# Patient Record
Sex: Female | Born: 1980
Health system: Southern US, Community
[De-identification: ages and names within clinical notes are randomized; demographics above are authoritative.]

## PROBLEM LIST (undated history)

## (undated) HISTORY — PX: TUBAL LIGATION: SHX77

---

## 2008-08-16 ENCOUNTER — Emergency Department (HOSPITAL_COMMUNITY): Admission: EM | Admit: 2008-08-16 | Discharge: 2008-08-16 | Payer: Self-pay | Admitting: Emergency Medicine

## 2010-05-27 LAB — URINALYSIS, ROUTINE W REFLEX MICROSCOPIC
Nitrite: NEGATIVE
Protein, ur: 100 mg/dL — AB
Urobilinogen, UA: 2 mg/dL — ABNORMAL HIGH (ref 0.0–1.0)

## 2010-05-27 LAB — DIFFERENTIAL
Basophils Relative: 0 % (ref 0–1)
Monocytes Relative: 2 % — ABNORMAL LOW (ref 3–12)
Neutro Abs: 6.6 10*3/uL (ref 1.7–7.7)
Neutrophils Relative %: 69 % (ref 43–77)

## 2010-05-27 LAB — GC/CHLAMYDIA PROBE AMP, GENITAL: GC Probe Amp, Genital: NEGATIVE

## 2010-05-27 LAB — WET PREP, GENITAL
Trich, Wet Prep: NONE SEEN
Yeast Wet Prep HPF POC: NONE SEEN

## 2010-05-27 LAB — CBC
HCT: 42.6 % (ref 36.0–46.0)
Hemoglobin: 14.6 g/dL (ref 12.0–15.0)
MCHC: 34.4 g/dL (ref 30.0–36.0)
MCV: 104 fL — ABNORMAL HIGH (ref 78.0–100.0)
RDW: 13.1 % (ref 11.5–15.5)

## 2010-05-27 LAB — COMPREHENSIVE METABOLIC PANEL
Alkaline Phosphatase: 60 U/L (ref 39–117)
BUN: 17 mg/dL (ref 6–23)
Calcium: 9.2 mg/dL (ref 8.4–10.5)
Glucose, Bld: 91 mg/dL (ref 70–99)
Potassium: 3.9 mEq/L (ref 3.5–5.1)
Total Protein: 7.3 g/dL (ref 6.0–8.3)

## 2010-05-27 LAB — URINE MICROSCOPIC-ADD ON

## 2010-07-24 ENCOUNTER — Emergency Department (HOSPITAL_BASED_OUTPATIENT_CLINIC_OR_DEPARTMENT_OTHER)
Admission: EM | Admit: 2010-07-24 | Discharge: 2010-07-24 | Disposition: A | Discharge status: Home / Self Care | Payer: PRIVATE HEALTH INSURANCE | Attending: Emergency Medicine | Admitting: Emergency Medicine

## 2010-07-24 DIAGNOSIS — K029 Dental caries, unspecified: Secondary | ICD-10-CM | POA: Insufficient documentation

## 2010-07-24 DIAGNOSIS — K089 Disorder of teeth and supporting structures, unspecified: Secondary | ICD-10-CM | POA: Insufficient documentation

## 2011-10-29 ENCOUNTER — Emergency Department (HOSPITAL_BASED_OUTPATIENT_CLINIC_OR_DEPARTMENT_OTHER)
Admission: EM | Admit: 2011-10-29 | Discharge: 2011-10-29 | Disposition: A | Discharge status: Home / Self Care | Payer: Self-pay | Attending: Emergency Medicine | Admitting: Emergency Medicine

## 2011-10-29 ENCOUNTER — Encounter (HOSPITAL_BASED_OUTPATIENT_CLINIC_OR_DEPARTMENT_OTHER): Payer: Self-pay | Admitting: Family Medicine

## 2011-10-29 DIAGNOSIS — F17200 Nicotine dependence, unspecified, uncomplicated: Secondary | ICD-10-CM | POA: Insufficient documentation

## 2011-10-29 DIAGNOSIS — R609 Edema, unspecified: Secondary | ICD-10-CM | POA: Insufficient documentation

## 2011-10-29 DIAGNOSIS — R600 Localized edema: Secondary | ICD-10-CM

## 2011-10-29 LAB — URINALYSIS, ROUTINE W REFLEX MICROSCOPIC
Bilirubin Urine: NEGATIVE
Glucose, UA: NEGATIVE mg/dL
Hgb urine dipstick: NEGATIVE
Specific Gravity, Urine: 1.011 (ref 1.005–1.030)
pH: 6.5 (ref 5.0–8.0)

## 2011-10-29 LAB — BASIC METABOLIC PANEL
BUN: 11 mg/dL (ref 6–23)
CO2: 25 mEq/L (ref 19–32)
Chloride: 103 mEq/L (ref 96–112)
Glucose, Bld: 96 mg/dL (ref 70–99)
Potassium: 3.9 mEq/L (ref 3.5–5.1)

## 2011-10-29 LAB — CBC
HCT: 37.7 % (ref 36.0–46.0)
Hemoglobin: 13.8 g/dL (ref 12.0–15.0)
MCH: 36.1 pg — ABNORMAL HIGH (ref 26.0–34.0)
MCHC: 36.6 g/dL — ABNORMAL HIGH (ref 30.0–36.0)

## 2011-10-29 NOTE — ED Provider Notes (Signed)
History     CSN: [id number]  Arrival date & time 10/29/11  0900   First MD Initiated Contact with Patient 10/29/11 [phone]      Chief Complaint  Patient presents with  . Foot Swelling     The history is provided by the patient.   patient reports developing new swelling to her bilateral ankles.  She reports her ankles feel puffy.  She's had no recent long travel or surgery.  No history of DVT or pulmonary embolism.  No family history of thromboembolic disease.  She has no chest pain or shortness of breath.  She reports that her urination has decreased somewhat.  She has no dysuria or urinary frequency.  No fevers or chills.  No pain with ambulation.  She reports her lower extremities ache  History reviewed. No pertinent past medical history.  Past Surgical History  Procedure Date  . Tubal ligation     No family history on file.  History  Substance Use Topics  . Smoking status: Current Every Day Smoker  . Smokeless tobacco: Not on file  . Alcohol Use: Yes    OB History    Grav Para Term Preterm Abortions TAB SAB Ect Mult Living                  Review of Systems  All other systems reviewed and are negative.    Allergies  Review of patient's allergies indicates no known allergies.  Home Medications  No current outpatient prescriptions on file.  BP 133/89  Pulse 92  Temp 98.3 F (36.8 C) (Oral)  Resp 16  Ht 5\' 7"  (1.702 m)  Wt 264 lb (119.75 kg)  BMI 41.35 kg/m2  SpO2 99%  LMP 10/23/2011  Physical Exam  Nursing note and vitals reviewed. Constitutional: She is oriented to person, place, and time. She appears well-developed and well-nourished. No distress.  HENT:  Head: Normocephalic and atraumatic.  Eyes: EOM are normal.  Neck: Normal range of motion.  Cardiovascular: Normal rate, regular rhythm and normal heart sounds.   Pulmonary/Chest: Effort normal and breath sounds normal.  Abdominal: Soft. She exhibits no distension. There is no tenderness.    Musculoskeletal: Normal range of motion.       Mild swelling of bilateral ankles.  She has no significant swelling of her thighs or lower legs.  Normal pulses in her bilateral PT and DP pulses.  No erythema or warmth.  No significant pain with range of motion of her bilateral ankles   Neurological: She is alert and oriented to person, place, and time.  Skin: Skin is warm and dry.  Psychiatric: She has a normal mood and affect. Judgment normal.    ED Course  Procedures (including critical care time)  Labs Reviewed  BASIC METABOLIC PANEL - Abnormal; Notable for the following:    GFR calc non Af Amer 84 (*)     All other components within normal limits  CBC - Abnormal; Notable for the following:    RBC 3.82 (*)     MCH 36.1 (*)     MCHC 36.6 (*)     All other components within normal limits  URINALYSIS, ROUTINE W REFLEX MICROSCOPIC   No results found.   1. Bilateral lower extremity edema       MDM  Vital signs are normal.  Mild swelling of bilateral ankles.  My suspicion for deep vein thrombosis is very low as the patient has no risk factors for this.  She has  no evidence of nephrotic syndrome.  I recommended elevation compression stockings and PCP followup.  The patient understands to return the emergency apartment for new or worsening symptoms.        Lyanne Co, MD 10/29/11 [phone]

## 2011-10-29 NOTE — ED Notes (Addendum)
Pt c/o bilateral legs and feet swelling "off and on but worse last night". Pt c/o tightness and worse with ambulation. Pt denies shob, cough.

## 2012-09-27 ENCOUNTER — Encounter (HOSPITAL_BASED_OUTPATIENT_CLINIC_OR_DEPARTMENT_OTHER): Payer: Self-pay | Admitting: *Deleted

## 2012-09-27 ENCOUNTER — Emergency Department (HOSPITAL_BASED_OUTPATIENT_CLINIC_OR_DEPARTMENT_OTHER)
Admission: EM | Admit: 2012-09-27 | Discharge: 2012-09-27 | Disposition: A | Discharge status: Home / Self Care | Payer: Self-pay | Attending: Emergency Medicine | Admitting: Emergency Medicine

## 2012-09-27 ENCOUNTER — Emergency Department (HOSPITAL_BASED_OUTPATIENT_CLINIC_OR_DEPARTMENT_OTHER): Payer: Self-pay

## 2012-09-27 DIAGNOSIS — W108XXA Fall (on) (from) other stairs and steps, initial encounter: Secondary | ICD-10-CM | POA: Insufficient documentation

## 2012-09-27 DIAGNOSIS — M25561 Pain in right knee: Secondary | ICD-10-CM

## 2012-09-27 DIAGNOSIS — F17200 Nicotine dependence, unspecified, uncomplicated: Secondary | ICD-10-CM | POA: Insufficient documentation

## 2012-09-27 DIAGNOSIS — Y92009 Unspecified place in unspecified non-institutional (private) residence as the place of occurrence of the external cause: Secondary | ICD-10-CM | POA: Insufficient documentation

## 2012-09-27 DIAGNOSIS — X500XXA Overexertion from strenuous movement or load, initial encounter: Secondary | ICD-10-CM | POA: Insufficient documentation

## 2012-09-27 DIAGNOSIS — S8990XA Unspecified injury of unspecified lower leg, initial encounter: Secondary | ICD-10-CM | POA: Insufficient documentation

## 2012-09-27 DIAGNOSIS — Y939 Activity, unspecified: Secondary | ICD-10-CM | POA: Insufficient documentation

## 2012-09-27 MED ORDER — IBUPROFEN 800 MG PO TABS: 800.0000 mg | ORAL_TABLET | Freq: Three times a day (TID) | ORAL | Status: DC | PRN

## 2012-09-27 NOTE — ED Provider Notes (Signed)
CSN: [id number]     Arrival date & time 09/27/12  1007 History     First MD Initiated Contact with Patient 09/27/12 1019     Chief Complaint  Patient presents with  . Knee Pain   (Consider location/radiation/quality/duration/timing/severity/associated sxs/prior Treatment) HPI 32 year old female presents with knee pain.  Patient reports that Saturday 8/9 she fell going down the steps.  When she fell she twist (internally rotated) her right knee and heard a "pop."  She reports pain 4-5/10 in severity and with certain ROM 10/10 in severity.  The pain is located on the medial aspect of her right knee.  She endorses some associated swelling and difficulty bearing weight.  She is able to ambulate but attempts to avoid pain by shifting the weight to her left leg.  She has used a heating pad and ice with some relief in pain.    History reviewed. No pertinent past medical history. Past Surgical History  Procedure Laterality Date  . Tubal ligation     No family history on file. History  Substance Use Topics  . Smoking status: Current Every Day Smoker  . Smokeless tobacco: Not on file  . Alcohol Use: Yes   OB History   Grav Para Term Preterm Abortions TAB SAB Ect Mult Living                 Review of Systems  Constitutional: Negative for fever and chills.  Respiratory: Negative for shortness of breath.   Cardiovascular: Positive for leg swelling. Negative for chest pain.  Musculoskeletal: Positive for joint swelling.    Allergies  Review of patient's allergies indicates no known allergies.  Home Medications  No current outpatient prescriptions on file. BP 126/76  Pulse 74  Temp(Src) 98.4 F (36.9 C) (Oral)  Resp 16  SpO2 100%  LMP 09/17/2012 Physical Exam  Constitutional: She is oriented to person, place, and time. She appears well-developed and well-nourished. No distress.  Cardiovascular: Normal rate, regular rhythm and normal heart sounds.   No murmur  heard. Pulmonary/Chest: Effort normal and breath sounds normal. She has no wheezes. She has no rales.  Musculoskeletal:  Left knee - normal ROM. Negative anterior and posterior drawer.  No laxity with varus or valgus stress.  Right knee - Negative lachman's and posterior drawer.  Tender to palpation at the medial joint line.   No appreciable swelling, however this is difficult to discern secondary to body habitus. Pain with valgus stress.  Hips - no pain or decreased ROM bilaterally.  Neurological: She is alert and oriented to person, place, and time.  Skin: Skin is warm and dry.    ED Course   Procedures (including critical care time)  Labs Reviewed - No data to display Dg Knee Complete 4 Views Right  09/27/2012   *RADIOLOGY REPORT*  Clinical Data: Right knee pain following injury.  RIGHT KNEE - COMPLETE 4+ VIEW  Comparison: None.  Findings: No acute fracture or dislocation is noted.  No soft tissue changes are seen.  IMPRESSION: No acute abnormality noted.   Original Report Authenticated By: Alcide Clever, M.D.   No diagnosis found.  MDM  32 year old female presents with Right knee pain following fall on 8/9. - Complete 4 View of Right knee obtained; Xray negative as above.  - Given physical exam findings and mechanism of injury, patient likely has underlying MCL tear or meniscus tear.  - Our hospital does not have MRI capabilities today.  Will place in  knee immobilizer and send to Local sports medicine physician - Dr. Pearletha Forge.   Tommie Sams, DO 09/27/12 1241

## 2012-09-27 NOTE — ED Notes (Signed)
Patient states she stepped off her last step and fell in her grass three days ago.  Now has pain in right left and knee.

## 2012-09-30 NOTE — ED Provider Notes (Signed)
I saw and evaluated the patient, reviewed the resident's note and I agree with the findings and plan.   .Face to face Exam:  General:  Awake HEENT:  Atraumatic Resp:  Normal effort Abd:  Nondistended Neuro:No focal weakness   Nelia Shi, MD 09/30/12 [phone]

## 2014-08-26 ENCOUNTER — Encounter (HOSPITAL_BASED_OUTPATIENT_CLINIC_OR_DEPARTMENT_OTHER): Payer: Self-pay | Admitting: *Deleted

## 2014-08-26 ENCOUNTER — Emergency Department (HOSPITAL_BASED_OUTPATIENT_CLINIC_OR_DEPARTMENT_OTHER): Payer: PRIVATE HEALTH INSURANCE

## 2014-08-26 ENCOUNTER — Emergency Department (HOSPITAL_BASED_OUTPATIENT_CLINIC_OR_DEPARTMENT_OTHER)
Admission: EM | Admit: 2014-08-26 | Discharge: 2014-08-26 | Disposition: A | Discharge status: Home / Self Care | Payer: Self-pay | Attending: Emergency Medicine | Admitting: Emergency Medicine

## 2014-08-26 DIAGNOSIS — N939 Abnormal uterine and vaginal bleeding, unspecified: Secondary | ICD-10-CM

## 2014-08-26 DIAGNOSIS — R102 Pelvic and perineal pain: Secondary | ICD-10-CM

## 2014-08-26 DIAGNOSIS — Z9851 Tubal ligation status: Secondary | ICD-10-CM | POA: Insufficient documentation

## 2014-08-26 DIAGNOSIS — Z3202 Encounter for pregnancy test, result negative: Secondary | ICD-10-CM | POA: Insufficient documentation

## 2014-08-26 DIAGNOSIS — R52 Pain, unspecified: Secondary | ICD-10-CM

## 2014-08-26 DIAGNOSIS — Z720 Tobacco use: Secondary | ICD-10-CM | POA: Insufficient documentation

## 2014-08-26 DIAGNOSIS — N938 Other specified abnormal uterine and vaginal bleeding: Secondary | ICD-10-CM | POA: Insufficient documentation

## 2014-08-26 LAB — URINALYSIS, ROUTINE W REFLEX MICROSCOPIC
Bilirubin Urine: NEGATIVE
GLUCOSE, UA: NEGATIVE mg/dL
Hgb urine dipstick: NEGATIVE
KETONES UR: NEGATIVE mg/dL
LEUKOCYTES UA: NEGATIVE
NITRITE: NEGATIVE
PROTEIN: NEGATIVE mg/dL
Specific Gravity, Urine: 1.019 (ref 1.005–1.030)
UROBILINOGEN UA: 0.2 mg/dL (ref 0.0–1.0)
pH: 6.5 (ref 5.0–8.0)

## 2014-08-26 LAB — PREGNANCY, URINE: PREG TEST UR: NEGATIVE

## 2014-08-26 MED ORDER — KETOROLAC TROMETHAMINE 30 MG/ML IJ SOLN
30.0000 mg | Freq: Once | INTRAMUSCULAR | Status: AC
  Administered 2014-08-26: 30 mg via INTRAVENOUS
  Filled 2014-08-26: qty 1

## 2014-08-26 MED ORDER — MORPHINE SULFATE 4 MG/ML IJ SOLN
4.0000 mg | Freq: Once | INTRAMUSCULAR | Status: AC
  Administered 2014-08-26: 4 mg via INTRAVENOUS
  Filled 2014-08-26: qty 1

## 2014-08-26 NOTE — ED Notes (Signed)
Pt transported to ultrasound by technician.

## 2014-08-26 NOTE — Discharge Instructions (Signed)
[facility] with [facility]. The contact information has been provided in this discharge summary. Call to arrange this appointment for the next week.   Abnormal Uterine Bleeding Abnormal uterine bleeding can affect women at various stages in life, including teenagers, women in their reproductive years, pregnant women, and women who have reached menopause. Several kinds of uterine bleeding are considered abnormal, including:  Bleeding or spotting between periods.   Bleeding after sexual intercourse.   Bleeding that is heavier or more than normal.   Periods that last longer than usual.  Bleeding after menopause.  Many cases of abnormal uterine bleeding are minor and simple to treat, while others are more serious. Any type of abnormal bleeding should be evaluated by your health care provider. Treatment will depend on the cause of the bleeding. HOME CARE INSTRUCTIONS Monitor your condition for any changes. The following actions may help to alleviate any discomfort you are experiencing:  Avoid the use of tampons and douches as directed by your health care provider.  Change your pads frequently. You should get regular pelvic exams and Pap tests. Keep all follow-up appointments for diagnostic tests as directed by your health care provider.  SEEK MEDICAL CARE IF:   Your bleeding lasts more than 1 week.   You feel dizzy at times.  SEEK IMMEDIATE MEDICAL CARE IF:   You pass out.   You are changing pads every 15 to 30 minutes.   You have abdominal pain.  You have a fever.   You become sweaty or weak.   You are passing large blood clots from the vagina.   You start to feel nauseous and vomit. MAKE SURE YOU:   Understand these instructions.  Will watch your condition.  Will get help right away if you are not doing well or get worse. Document Released: 02/03/2005 Document Revised: 02/08/2013 Document Reviewed: 09/02/2012 [facility] Patient  Information 2015 [employer] [place]. This information is not intended to replace advice given to you by your health care provider. Make sure you discuss any questions you have with your health care provider.

## 2014-08-26 NOTE — ED Notes (Signed)
abd pain x 2 weeks- states her LMP was early and lasted longer than usual, with heavier bleeding- frequent BM since yesterday

## 2014-08-26 NOTE — ED Notes (Signed)
Pt states bloating and abdominal pain since July 3rd with onset of MP.  Period has since stopped with exception of blood clots and pt c/o sharp pains and abdominal cramping.  Pt states diarrhea started yesterday and has gone to push out BM's 6 times today with no persisting diarrhea but an urge to go with small amounts produced.

## 2014-08-26 NOTE — ED Provider Notes (Signed)
CSN: [id number]   Arrival date & time 08/26/14 1626  History  This chart was scribed for  Geoffery Lyons, MD by Bethel Born, ED Scribe. This patient was seen in room MH03/MH03 and the patient's care was started at 5:17 PM.  Chief Complaint  Patient presents with  . Abdominal Pain    HPI Patient is a 34 y.o. female presenting with abdominal pain. The history is provided by the patient.  Abdominal Pain Pain location:  LLQ and RLQ Pain quality: stabbing   Pain severity:  Moderate Onset quality:  Gradual Duration:  2 weeks Timing:  Constant Progression:  Worsening Chronicity:  New Relieved by:  Nothing Worsened by:  Nothing tried Ineffective treatments:  NSAIDs Associated symptoms: vomiting   Associated symptoms: no chest pain, no chills, no constipation, no diarrhea, no dysuria and no fever    Randy Biederman is a 34 y.o. female who presents to the Emergency Department complaining of constant lower abdominal pain with onset 2 weeks ago. The pain is rated 6/10 in severity and described as stabbing. Ibuprofen provided insufficient relief PTA. Associated symptoms include vomiting 4 days ago, and change in menstrual cycle (Irregular, more clotting, heavier, 1 day longer). Pt denies fever, diarrhea, constipation, and dysuria.  Pt states that she was screened for STDs 2 weeks ago at a clinic [facility]. At that time she had a pelvic exam. All tests were negative.No history of ovarian cyst.   History reviewed. No pertinent past medical history.  Past Surgical History  Procedure Laterality Date  . Tubal ligation      No family history on file.  History  Substance Use Topics  . Smoking status: Current Every Day Smoker    Types: Cigarettes  . Smokeless tobacco: Never Used  . Alcohol Use: Yes     Comment: occasional     Review of Systems  Constitutional: Negative for fever and chills.  Cardiovascular: Negative for chest pain.  Gastrointestinal: Positive for vomiting and abdominal  pain. Negative for diarrhea and constipation.  Genitourinary: Negative for dysuria.       Change in menstrual cycle  All other systems reviewed and are negative.   Home Medications   Prior to Admission medications   Medication Sig Start Date End Date Taking? Authorizing Provider  ibuprofen (ADVIL,MOTRIN) 800 MG tablet Take 1 tablet (800 mg total) by mouth every 8 (eight) hours as needed for pain. 09/27/12   Tommie Sams, MD    Allergies  Latex  Triage Vitals: BP 145/83 mmHg  Pulse 86  Temp(Src) 98.4 F (36.9 C) (Oral)  Resp 18  Ht  (1.702 m)  Wt 245 lb (111.131 kg)  BMI 38.36 kg/m2  SpO2 100%  LMP 08/20/2014  Physical Exam  Constitutional: She is oriented to person, place, and time and well-developed, well-nourished, and in no distress. No distress.  HENT:  Head: Normocephalic and atraumatic.  Neck: Normal range of motion. Neck supple.  Cardiovascular: Normal rate, regular rhythm and normal heart sounds.   No murmur heard. Pulmonary/Chest: Effort normal and breath sounds normal. No respiratory distress.  Abdominal: Soft. Bowel sounds are normal. She exhibits no distension. There is tenderness. There is no rebound and no guarding.  TTP in the lower abdomen including RLQ, suprapubic, but most notably in the LLQ.   Musculoskeletal: Normal range of motion. She exhibits no edema.  Neurological: She is alert and oriented to person, place, and time.  Skin: Skin is warm and dry. She is not diaphoretic.  Nursing note and vitals reviewed.   ED Course  Procedures   DIAGNOSTIC STUDIES: Oxygen Saturation is 100% on RA, normal by my interpretation.    COORDINATION OF CARE: 5:25 PM Discussed treatment plan which includes lab work, pelvic US, morphine, and Toradol  with pt at bedside and pt agreed to plan.  Labs Review-  Labs Reviewed  PREGNANCY, URINE  URINALYSIS, ROUTINE W REFLEX MICROSCOPIC (NOT AT [facility])    Imaging Review No results found.  EKG  Interpretation None    MDM   Final diagnoses:  None   Patient presents here with complaints of vaginal bleeding for the past week. She reports abdominal pain ongoing for several weeks and was seen at the community health for this. She apparently had a pelvic examination and all the studies performed which were unremarkable. She is now having bleeding and presents for this. Her hemoglobin is stable and laboratory studies are otherwise unremarkable. I did perform an ultrasound which revealed no evidence for ovarian cyst, but did show a small uterine fibroid. This may be the cause of her bleeding and I feel she requires follow-up with GYN. She will be given the number for the women[facility] hospital clinic and advised to follow-up with them if her bleeding persists.  I personally performed the services described in this documentation, which was scribed in my presence. The recorded information has been reviewed and is accurate.       Geoffery Lyonsouglas Tyrez Berrios, MD 08/26/14 [phone]

## 2015-02-13 ENCOUNTER — Encounter (HOSPITAL_BASED_OUTPATIENT_CLINIC_OR_DEPARTMENT_OTHER): Payer: Self-pay | Admitting: Emergency Medicine

## 2015-02-13 ENCOUNTER — Emergency Department (HOSPITAL_BASED_OUTPATIENT_CLINIC_OR_DEPARTMENT_OTHER)
Admission: EM | Admit: 2015-02-13 | Discharge: 2015-02-13 | Disposition: A | Discharge status: Home / Self Care | Payer: PRIVATE HEALTH INSURANCE | Attending: Emergency Medicine | Admitting: Emergency Medicine

## 2015-02-13 DIAGNOSIS — Z91040 Latex allergy status: Secondary | ICD-10-CM | POA: Insufficient documentation

## 2015-02-13 DIAGNOSIS — J069 Acute upper respiratory infection, unspecified: Secondary | ICD-10-CM

## 2015-02-13 DIAGNOSIS — J029 Acute pharyngitis, unspecified: Secondary | ICD-10-CM

## 2015-02-13 DIAGNOSIS — F17210 Nicotine dependence, cigarettes, uncomplicated: Secondary | ICD-10-CM | POA: Insufficient documentation

## 2015-02-13 LAB — RAPID STREP SCREEN (MED CTR MEBANE ONLY): Streptococcus, Group A Screen (Direct): NEGATIVE

## 2015-02-13 MED ORDER — DEXAMETHASONE 6 MG PO TABS
12.0000 mg | ORAL_TABLET | Freq: Once | ORAL | Status: AC
  Administered 2015-02-13: 12 mg via ORAL
  Filled 2015-02-13: qty 2

## 2015-02-13 NOTE — ED Notes (Signed)
Pt reports sore throat that feels like it is burning, associated with cough and congestion that started yesterday

## 2015-02-13 NOTE — ED Notes (Signed)
MD at bedside. 

## 2015-02-13 NOTE — Discharge Instructions (Signed)
Pharyngitis Pharyngitis is redness, pain, and swelling (inflammation) of your pharynx.  CAUSES  Pharyngitis is usually caused by infection. Most of the time, these infections are from viruses (viral) and are part of a cold. However, sometimes pharyngitis is caused by bacteria (bacterial). Pharyngitis can also be caused by allergies. Viral pharyngitis may be spread from person to person by coughing, sneezing, and personal items or utensils (cups, forks, spoons, toothbrushes). Bacterial pharyngitis may be spread from person to person by more intimate contact, such as kissing.  SIGNS AND SYMPTOMS  Symptoms of pharyngitis include:   Sore throat.   Tiredness (fatigue).   Low-grade fever.   Headache.  Joint pain and muscle aches.  Skin rashes.  Swollen lymph nodes.  Plaque-like film on throat or tonsils (often seen with bacterial pharyngitis). DIAGNOSIS  Your health care provider will ask you questions about your illness and your symptoms. Your medical history, along with a physical exam, is often all that is needed to diagnose pharyngitis. Sometimes, a rapid strep test is done. Other lab tests may also be done, depending on the suspected cause.  TREATMENT  Viral pharyngitis will usually get better in 3-4 days without the use of medicine. Bacterial pharyngitis is treated with medicines that kill germs (antibiotics).  HOME CARE INSTRUCTIONS   Drink enough water and fluids to keep your urine clear or pale yellow.   Only take over-the-counter or prescription medicines as directed by your health care provider:   If you are prescribed antibiotics, make sure you finish them even if you start to feel better.   Do not take aspirin.   Get lots of rest.   Gargle with 8 oz of salt water ( tsp of salt per 1 qt of water) as often as every 1-2 hours to soothe your throat.   Throat lozenges (if you are not at risk for choking) or sprays may be used to soothe your throat. SEEK MEDICAL  CARE IF:   You have large, tender lumps in your neck.  You have a rash.  You cough up green, yellow-brown, or bloody spit. SEEK IMMEDIATE MEDICAL CARE IF:   Your neck becomes stiff.  You drool or are unable to swallow liquids.  You vomit or are unable to keep medicines or liquids down.  You have severe pain that does not go away with the use of recommended medicines.  You have trouble breathing (not caused by a stuffy nose). MAKE SURE YOU:   Understand these instructions.  Will watch your condition.  Will get help right away if you are not doing well or get worse.   This information is not intended to replace advice given to you by your health care provider. Make sure you discuss any questions you have with your health care provider.   Document Released: 02/03/2005 Document Revised: 11/24/2012 Document Reviewed: 10/11/2012 Elsevier Interactive Patient Education 2016 Elsevier Inc.  Cough, Adult Coughing is a reflex that clears your throat and your airways. Coughing helps to heal and protect your lungs. It is normal to cough occasionally, but a cough that happens with other symptoms or lasts a long time may be a sign of a condition that needs treatment. A cough may last only 2-3 weeks (acute), or it may last longer than 8 weeks (chronic). CAUSES Coughing is commonly caused by:  Breathing in substances that irritate your lungs.  A viral or bacterial respiratory infection.  Allergies.  Asthma.  Postnasal drip.  Smoking.  Acid backing up from the stomach  into the esophagus (gastroesophageal reflux).  Certain medicines.  Chronic lung problems, including COPD (or rarely, lung cancer).  Other medical conditions such as heart failure. HOME CARE INSTRUCTIONS  Pay attention to any changes in your symptoms. Take these actions to help with your discomfort:  Take medicines only as told by your health care provider.  If you were prescribed an antibiotic medicine, take it  as told by your health care provider. Do not stop taking the antibiotic even if you start to feel better.  Talk with your health care provider before you take a cough suppressant medicine.  Drink enough fluid to keep your urine clear or pale yellow.  If the air is dry, use a cold steam vaporizer or humidifier in your bedroom or your home to help loosen secretions.  Avoid anything that causes you to cough at work or at home.  If your cough is worse at night, try sleeping in a semi-upright position.  Avoid cigarette smoke. If you smoke, quit smoking. If you need help quitting, ask your health care provider.  Avoid caffeine.  Avoid alcohol.  Rest as needed. SEEK MEDICAL CARE IF:   You have new symptoms.  You cough up pus.  Your cough does not get better after 2-3 weeks, or your cough gets worse.  You cannot control your cough with suppressant medicines and you are losing sleep.  You develop pain that is getting worse or pain that is not controlled with pain medicines.  You have a fever.  You have unexplained weight loss.  You have night sweats. SEEK IMMEDIATE MEDICAL CARE IF:  You cough up blood.  You have difficulty breathing.  Your heartbeat is very fast.   This information is not intended to replace advice given to you by your health care provider. Make sure you discuss any questions you have with your health care provider.   Document Released: 08/02/2010 Document Revised: 10/25/2014 Document Reviewed: 04/12/2014 Elsevier Interactive Patient Education [employer] Elsevier Inc.

## 2015-02-15 LAB — CULTURE, GROUP A STREP: Strep A Culture: POSITIVE — AB

## 2015-02-16 ENCOUNTER — Telehealth (HOSPITAL_COMMUNITY): Payer: Self-pay

## 2015-02-16 NOTE — Telephone Encounter (Signed)
Post ED Visit - Positive Culture Follow-up: Successful Patient Follow-Up  Culture assessed and recommendations reviewed by: []  Enzo BiNathan Batchelder, Pharm.D. []  Celedonio MiyamotoJeremy Frens, Pharm.D., BCPS [x]  Garvin FilaMike Maccia, Pharm.D. []  Georgina PillionElizabeth Martin, Pharm.D., BCPS []  RichlandMinh Pham, [address] Rainbow BoulevardPharm.D., BCPS, AAHIVP []  Estella HuskMichelle Turner, Pharm.D., BCPS, AAHIVP []  Tennis Mustassie Stewart, Pharm.D. []  Sherle Poeob Vincent, [place]arm[place]D.  Positive throat culture, Group A Strep  [x]  Patient discharged without antimicrobial prescription and treatment is now indicated []  Organism is resistant to prescribed ED discharge antimicrobial []  Patient with positive blood cultures  Changes discussed with ED provider: Lonna CobbV. Pickering NP New antibiotic prescription Amoxicillin 1 gram po BID x 10 days [phone] and left on VM. @ 12:35  Contacted patient, date 02/16/2015, time 12:22   Arvid RightClark, Jacie Tristan Dorn 02/16/2015, 12:26 PM

## 2015-02-16 NOTE — Progress Notes (Signed)
ED Antimicrobial Stewardship Positive Culture Follow Up   Belinda Alvarado is an 34 y.o. female [facility] with a chief complaint of  Chief Complaint  Patient presents with  . Sore Throat    Recent Results (from the past 720 hour(s))  Rapid strep screen     Status: None   Collection Time: 02/13/15  9:13 AM  Result Value Ref Range Status   Streptococcus, Group A Screen (Direct) NEGATIVE NEGATIVE Final    Comment: (NOTE) A Rapid Antigen test may result negative if the antigen level in the sample is below the detection level of this test. The FDA has not cleared this test as a stand-alone test therefore the rapid antigen negative result has reflexed to a Group A Strep culture.   Culture, Group A Strep     Status: Abnormal   Collection Time: 02/13/15  9:13 AM  Result Value Ref Range Status   Strep A Culture Positive (A)  Corrected    Comment: (NOTE) Penicillin and ampicillin are drugs of choice for treatment of beta-hemolytic streptococcal infections. Susceptibility testing of penicillins and other beta-lactam agents approved by the FDA for treatment of beta-hemolytic streptococcal infections need not be performed routinely because nonsusceptible isolates are extremely rare in any beta-hemolytic streptococcus and have not been reported for Streptococcus pyogenes (group A). (CLSI 2011) Performed At: [facility] [place] [address] [place], [place] [id number] Belinda HomerHancock Belinda Alvarado GN:[phone]:[phone] CORRECTED ON 12/29 AT 1636: PREVIOUSLY REPORTED AS Comment    [x]  Patient discharged originally without antimicrobial agent and treatment is now indicated  New antibiotic prescription: Amoxicillin 1000 mg PO BID X 10 days  ED Provider: Glean HessElizabeth Westfall, PA-C  Bertram MillardMichael A [name] 02/16/2015, 8:20 AM Infectious Diseases Pharmacist Phone# [phone]

## 2015-02-23 NOTE — ED Provider Notes (Signed)
CSN: [id number]     Arrival date & time 02/13/15  [id number] History   First MD Initiated Contact with Patient 02/13/15 [phone]     Chief Complaint  Patient presents with  . Sore Throat     (Consider location/radiation/quality/duration/timing/severity/associated sxs/prior Treatment) HPI   34yF with sore throat. Onset yesterday. Persistent since then. Feels burning. Worse with swallowing and feels in both ears. Subjective fever. Cough. Feels congested.  No sob. No n/v. No sick contacts. Has not tired anything for symptoms.   History reviewed. No pertinent past medical history. Past Surgical History  Procedure Laterality Date  . Tubal ligation     History reviewed. No pertinent family history. Social History  Substance Use Topics  . Smoking status: Current Every Day Smoker    Types: Cigarettes  . Smokeless tobacco: Never Used  . Alcohol Use: Yes     Comment: occasional   OB History    No data available     Review of Systems  All systems reviewed and negative, other than as noted in HPI.   Allergies  Latex  Home Medications   Prior to Admission medications   Medication Sig Start Date End Date Taking? Authorizing Provider  Doxylamine-DM (VICKS NYQUIL COUGH) 6.25-15 MG/15ML LIQD Take by mouth.   Yes Historical Provider, MD  Pseudoephedrine-APAP-DM (DAYQUIL PO) Take by mouth.   Yes Historical Provider, MD  ibuprofen (ADVIL,MOTRIN) 800 MG tablet Take 1 tablet (800 mg total) by mouth every 8 (eight) hours as needed for pain. 09/27/12   Jayce G Cook, DO   BP 132/82 mmHg  Pulse 95  Temp(Src) 98.4 F (36.9 C) (Oral)  Resp 20  Ht 5\' 8"  (1.727 m)  Wt 245 lb (111.131 kg)  BMI 37.26 kg/m2  SpO2 100%  LMP 01/26/2015 Physical Exam  Constitutional: She appears well-developed and well-nourished. No distress.  HENT:  Head: Normocephalic and atraumatic.  Right Ear: External ear normal.  Left Ear: External ear normal.  Nose: Nose normal.  Mouth/Throat: Oropharyngeal exudate present.   Exudative pharyngitis. Uvula midline. Normal sounding voice. Neck supple. Shoddy tender L anterior cervical nodes.   Eyes: Conjunctivae are normal. Right eye exhibits no discharge. Left eye exhibits no discharge.  Neck: Neck supple.  Cardiovascular: Normal rate, regular rhythm and normal heart sounds.  Exam reveals no gallop and no friction rub.   No murmur heard. Pulmonary/Chest: Effort normal and breath sounds normal. No respiratory distress.  Abdominal: Soft. She exhibits no distension. There is no tenderness.  Musculoskeletal: She exhibits no edema or tenderness.  Neurological: She is alert.  Skin: Skin is warm and dry.  Psychiatric: She has a normal mood and affect. Her behavior is normal. Thought content normal.  Nursing note and vitals reviewed.   ED Course  Procedures (including critical care time) Labs Review Labs Reviewed  CULTURE, GROUP A STREP - Abnormal; Notable for the following:    Strep A Culture Positive (*)    All other components within normal limits  RAPID STREP SCREEN (NOT AT [facility])    Imaging Review No results found. I have personally reviewed and evaluated these images and lab results as part of my medical decision-making.   EKG Interpretation None      MDM   Final diagnoses:  URI (upper respiratory infection)  Pharyngitis    34yF with exudative pharyngitis. No evidence of significant airway compromise. Rapid strep negative. Symptomatic tx.     Raeford RazorStephen Emerlyn Mehlhoff, MD 02/23/15 1106

## 2015-03-27 ENCOUNTER — Encounter (HOSPITAL_BASED_OUTPATIENT_CLINIC_OR_DEPARTMENT_OTHER): Payer: Self-pay | Admitting: *Deleted

## 2015-03-27 ENCOUNTER — Emergency Department (HOSPITAL_BASED_OUTPATIENT_CLINIC_OR_DEPARTMENT_OTHER)
Admission: EM | Admit: 2015-03-27 | Discharge: 2015-03-27 | Disposition: A | Discharge status: Home / Self Care | Payer: PRIVATE HEALTH INSURANCE | Attending: Emergency Medicine | Admitting: Emergency Medicine

## 2015-03-27 DIAGNOSIS — F17210 Nicotine dependence, cigarettes, uncomplicated: Secondary | ICD-10-CM | POA: Insufficient documentation

## 2015-03-27 DIAGNOSIS — Z9851 Tubal ligation status: Secondary | ICD-10-CM | POA: Insufficient documentation

## 2015-03-27 DIAGNOSIS — N3000 Acute cystitis without hematuria: Secondary | ICD-10-CM | POA: Insufficient documentation

## 2015-03-27 DIAGNOSIS — Z3202 Encounter for pregnancy test, result negative: Secondary | ICD-10-CM | POA: Insufficient documentation

## 2015-03-27 DIAGNOSIS — Z91040 Latex allergy status: Secondary | ICD-10-CM | POA: Insufficient documentation

## 2015-03-27 LAB — CBC WITH DIFFERENTIAL/PLATELET
Basophils Absolute: 0 10*3/uL (ref 0.0–0.1)
Basophils Relative: 0 %
Eosinophils Absolute: 0.1 10*3/uL (ref 0.0–0.7)
Eosinophils Relative: 1 %
HCT: 40.2 % (ref 36.0–46.0)
Hemoglobin: 13.4 g/dL (ref 12.0–15.0)
Lymphocytes Relative: 34 %
Lymphs Abs: 1.8 10*3/uL (ref 0.7–4.0)
MCH: 34.3 pg — ABNORMAL HIGH (ref 26.0–34.0)
MCHC: 33.3 g/dL (ref 30.0–36.0)
MCV: 102.8 fL — ABNORMAL HIGH (ref 78.0–100.0)
Monocytes Absolute: 0.4 10*3/uL (ref 0.1–1.0)
Monocytes Relative: 8 %
Neutro Abs: 3 10*3/uL (ref 1.7–7.7)
Neutrophils Relative %: 57 %
Platelets: 206 10*3/uL (ref 150–400)
RBC: 3.91 MIL/uL (ref 3.87–5.11)
RDW: 12 % (ref 11.5–15.5)
WBC: 5.4 10*3/uL (ref 4.0–10.5)

## 2015-03-27 LAB — COMPREHENSIVE METABOLIC PANEL
ALT: 15 U/L (ref 14–54)
AST: 16 U/L (ref 15–41)
Albumin: 3.7 g/dL (ref 3.5–5.0)
Alkaline Phosphatase: 49 U/L (ref 38–126)
Anion gap: 7 (ref 5–15)
BUN: 14 mg/dL (ref 6–20)
CO2: 25 mmol/L (ref 22–32)
Calcium: 8.8 mg/dL — ABNORMAL LOW (ref 8.9–10.3)
Chloride: 107 mmol/L (ref 101–111)
Creatinine, Ser: 0.93 mg/dL (ref 0.44–1.00)
GFR calc Af Amer: >60 mL/min (ref >60)
GFR calc non Af Amer: >60 mL/min (ref >60)
Glucose, Bld: 81 mg/dL (ref 65–99)
Potassium: 4.2 mmol/L (ref 3.5–5.1)
Sodium: 139 mmol/L (ref 135–145)
Total Bilirubin: 0.5 mg/dL (ref 0.3–1.2)
Total Protein: 7.1 g/dL (ref 6.5–8.1)

## 2015-03-27 LAB — URINE MICROSCOPIC-ADD ON

## 2015-03-27 LAB — PREGNANCY, URINE: Preg Test, Ur: NEGATIVE

## 2015-03-27 LAB — URINALYSIS, ROUTINE W REFLEX MICROSCOPIC
Bilirubin Urine: NEGATIVE
Glucose, UA: NEGATIVE mg/dL
Ketones, ur: NEGATIVE mg/dL
Nitrite: NEGATIVE
Protein, ur: 30 mg/dL — AB
Specific Gravity, Urine: 1.017 (ref 1.005–1.030)
pH: 6 (ref 5.0–8.0)

## 2015-03-27 MED ORDER — NITROFURANTOIN MONOHYD MACRO 100 MG PO CAPS: 100.0000 mg | ORAL_CAPSULE | Freq: Two times a day (BID) | ORAL | Status: DC

## 2015-03-27 MED ORDER — PHENAZOPYRIDINE HCL 200 MG PO TABS: 200.0000 mg | ORAL_TABLET | Freq: Three times a day (TID) | ORAL | Status: DC

## 2015-03-27 MED ORDER — SODIUM CHLORIDE 0.9 % IV BOLUS (SEPSIS)
1000.0000 mL | Freq: Once | INTRAVENOUS | Status: AC
  Administered 2015-03-27: 1000 mL via INTRAVENOUS

## 2015-03-27 NOTE — Discharge Instructions (Signed)
Return here as needed.  Follow-up with your GYN doctor, increase her fluid intake, rest as much as possible

## 2015-03-27 NOTE — ED Provider Notes (Signed)
CSN: [id number]     Arrival date & time 03/27/15  1040 History   First MD Initiated Contact with Patient 03/27/15 1059     Chief Complaint  Patient presents with  . Abdominal Pain     (Consider location/radiation/quality/duration/timing/severity/associated sxs/prior Treatment) HPI Patient presents to the Emergency Department complaining of dysuria. She states that she had her LMP begin on 03/19/15 that lasted for 7 days, it was heavier than normal and she was passing large clots. After her menses ended, the patient continued to notice pink tinged toiled paper when she would wipe after urinating. The patient notes that she has a stinging sensation after urination that will often bring her to tears. She is sexually active, but has not been so in a month. She also notes urinary leakage when she coughs. She denies abdominal pain, nausea, vomiting, diarrhea. Denies chest pain, has SOB that she attributes to her weight. She endorses occasional lightheadedness.  History reviewed. No pertinent past medical history. Past Surgical History  Procedure Laterality Date  . Tubal ligation     No family history on file. Social History  Substance Use Topics  . Smoking status: Current Every Day Smoker    Types: Cigarettes  . Smokeless tobacco: Never Used  . Alcohol Use: Yes     Comment: occasional   OB History    No data available     Review of Systems All other systems negative except as documented in the HPI. All pertinent positives and negatives as reviewed in the HPI.    Allergies  Latex  Home Medications   Prior to Admission medications   Medication Sig Start Date End Date Taking? Authorizing Provider  ibuprofen (ADVIL,MOTRIN) 800 MG tablet Take 1 tablet (800 mg total) by mouth every 8 (eight) hours as needed for pain. 09/27/12  Yes Tommie Sams, DO  Doxylamine-DM (VICKS NYQUIL COUGH) 6.25-15 MG/15ML LIQD Take by mouth.    Historical Provider, MD  Pseudoephedrine-APAP-DM (DAYQUIL PO) Take  by mouth.    Historical Provider, MD   BP 130/83 mmHg  Pulse 90  Temp(Src) 98.1 F (36.7 C) (Oral)  Resp 16  Ht  (1.702 m)  Wt 108.863 kg  BMI 37.58 kg/m2  SpO2 100%  LMP 03/19/2015 Physical Exam  Constitutional: She is oriented to person, place, and time. She appears well-developed and well-nourished.  HENT:  Head: Normocephalic and atraumatic.  Eyes: Pupils are equal, round, and reactive to light.  Neck: Normal range of motion. Neck supple.  Cardiovascular: Normal rate and regular rhythm.  Exam reveals no gallop and no friction rub.   No murmur heard. Pulmonary/Chest: Breath sounds normal. No respiratory distress. She has no wheezes. She has no rales.  Abdominal: Soft. Bowel sounds are normal. She exhibits no distension. There is no tenderness. There is no rebound.  Musculoskeletal: Normal range of motion.  Neurological: She is alert and oriented to person, place, and time.  Skin: Skin is warm and dry.  Psychiatric: She has a normal mood and affect. Her behavior is normal. Thought content normal.    ED Course  Procedures (including critical care time) Labs Review Labs Reviewed  COMPREHENSIVE METABOLIC PANEL - Abnormal; Notable for the following:    Calcium 8.8 (*)    All other components within normal limits  CBC WITH DIFFERENTIAL/PLATELET - Abnormal; Notable for the following:    MCV 102.8 (*)    MCH 34.3 (*)    All other components within normal limits  URINALYSIS, ROUTINE W REFLEX  MICROSCOPIC (NOT AT [facility]) - Abnormal; Notable for the following:    APPearance CLOUDY (*)    Hgb urine dipstick LARGE (*)    Protein, ur 30 (*)    Leukocytes, UA MODERATE (*)    All other components within normal limits  URINE MICROSCOPIC-ADD ON - Abnormal; Notable for the following:    Squamous Epithelial / LPF 0-5 (*)    Bacteria, UA FEW (*)    All other components within normal limits  PREGNANCY, URINE    Imaging Review No results found. I have personally reviewed and  evaluated these images and lab results as part of my medical decision-making.   Patient will be treated for urinary tract infection based on her history of present illness and physical exam findings, along with her urine.  Told to return here as needed.  Patient agrees the plan and all questions were answered.  I advised her to follow-up with her primary Dr. for further evaluation  Charlestine Night, PA-C 03/27/15 1333  Tilden Fossa, MD 03/28/15 [phone]

## 2015-03-27 NOTE — ED Notes (Signed)
Patient texting and taking photos on cell phone, NAD noted at this time.

## 2015-03-27 NOTE — ED Notes (Signed)
Patient states on 03/19/15 she started menstrual early.  Describes the cycle as a normal cycle for her.  Patient states during this same time, she has had pain and burning with urination.

## 2015-03-28 LAB — URINE CULTURE

## 2015-09-16 IMAGING — US US TRANSVAGINAL NON-OB
1 series · 14 of 25 positions shown · non-contrast
Comparison: CT scan of August 16, 2008.

CLINICAL DATA: Left lower quadrant pelvic pain for 2 weeks.

EXAM:
TRANSABDOMINAL AND TRANSVAGINAL ULTRASOUND OF PELVIS
TECHNIQUE: Both transabdominal and transvaginal ultrasound examinations of the
pelvis were performed. Transabdominal technique was performed for
global imaging of the pelvis including uterus, ovaries, adnexal
regions, and pelvic cul-de-sac. It was necessary to proceed with
endovaginal exam following the transabdominal exam to visualize the
endometrium and ovaries.

[Series 1: us transvaginal non-ob · 0.22mm/px · 14 of 90 slices shown]
[im 1/90]
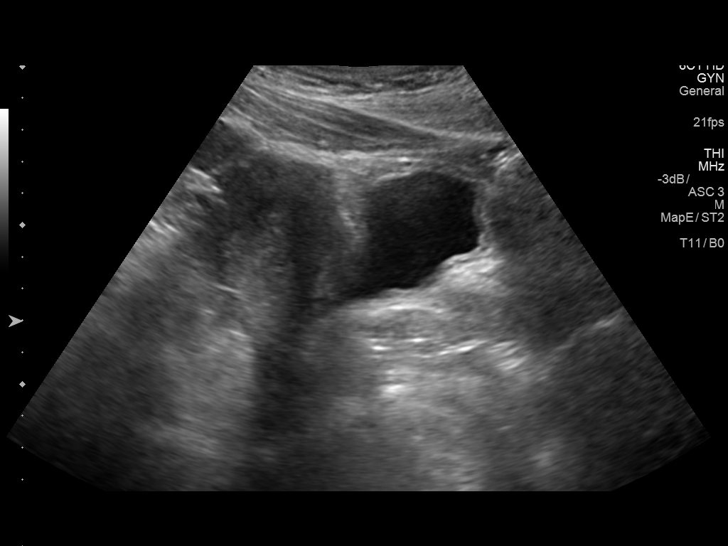
[im 8/90]
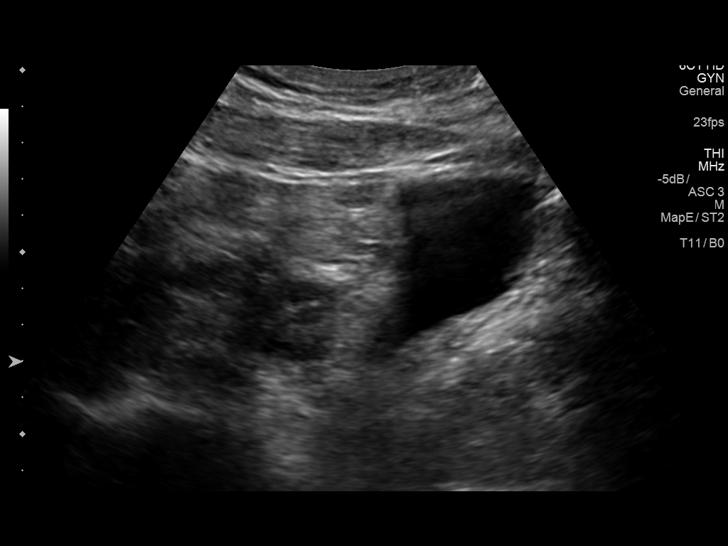
[im 15/90]
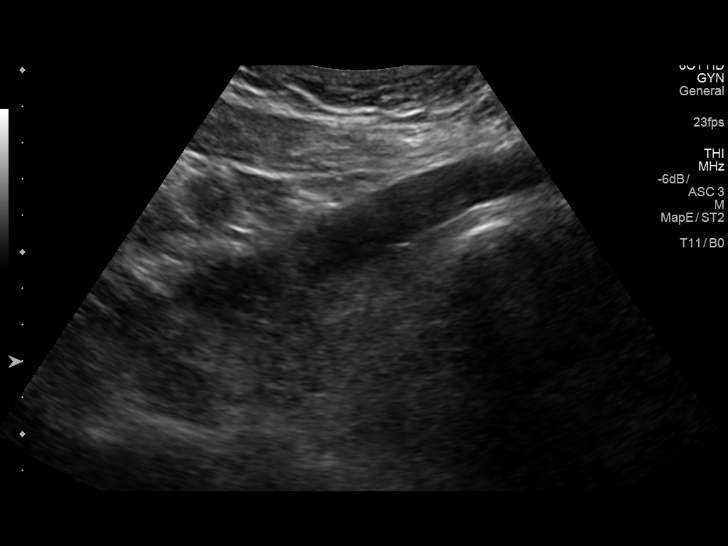
[im 23/90]
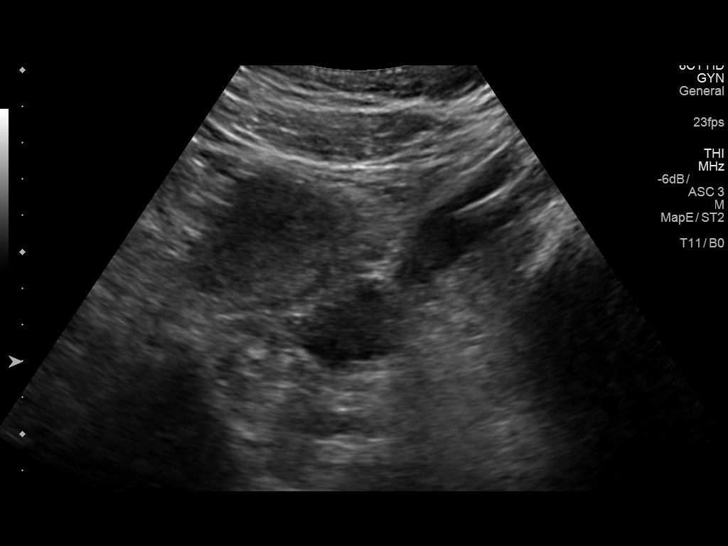
[im 30/90]
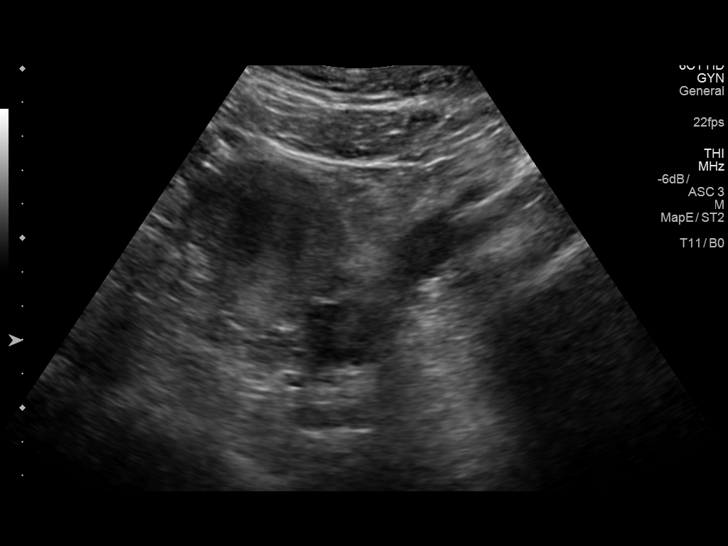
[im 34/90]
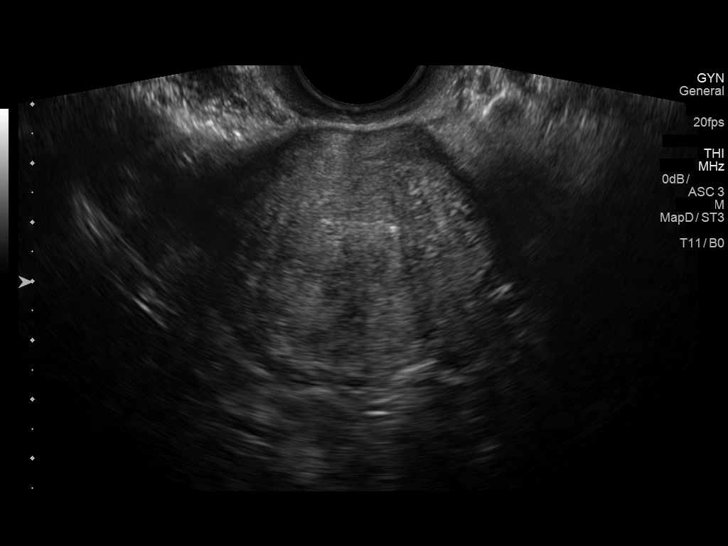
[im 41/90]
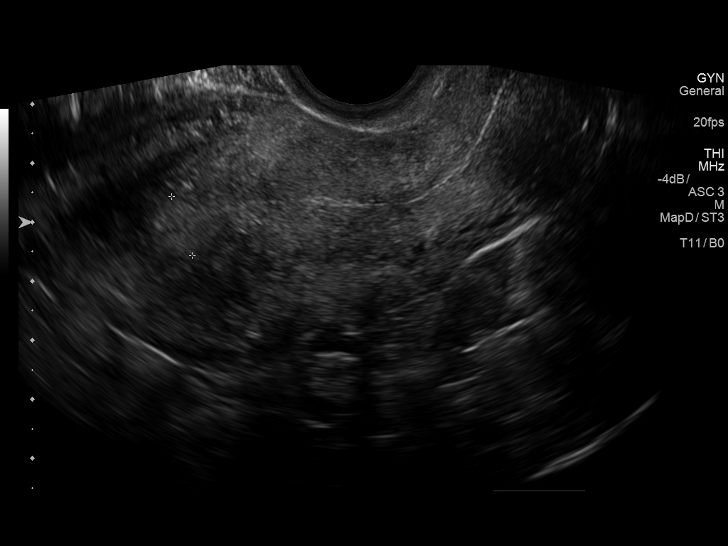
[im 49/90]
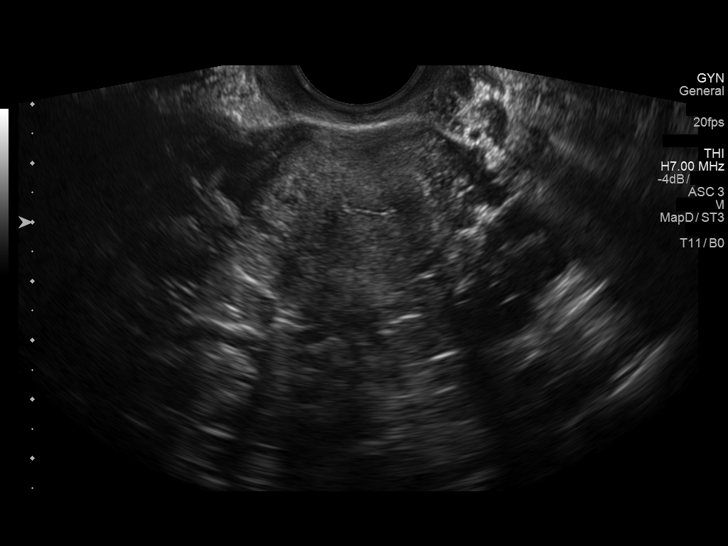
[im 56/90]
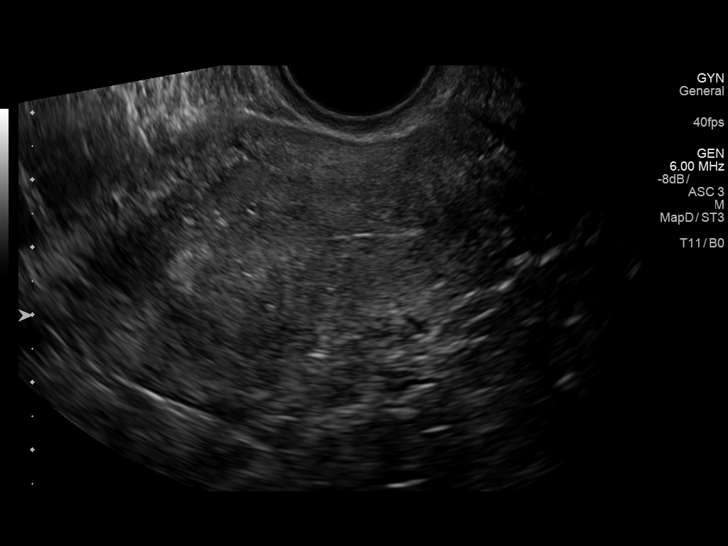
[im 60/90]
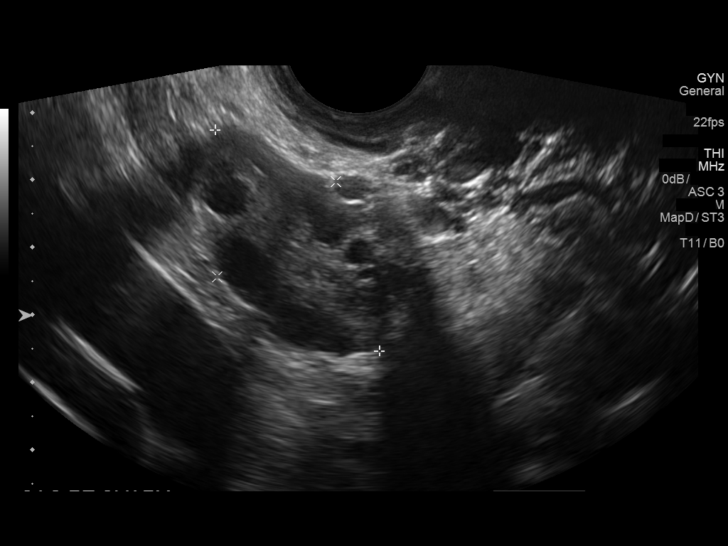
[im 67/90]
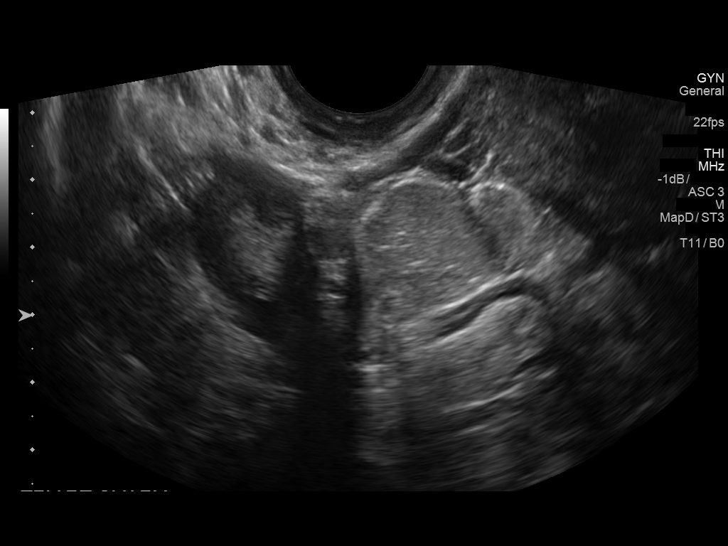
[im 75/90]
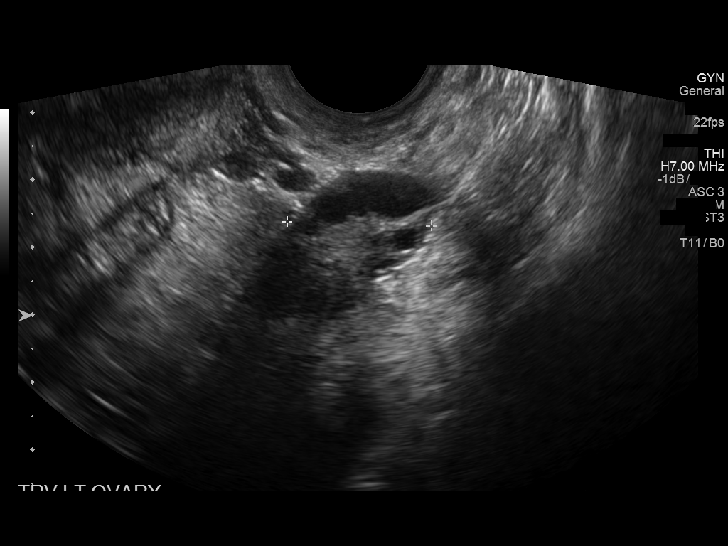
[im 82/90]
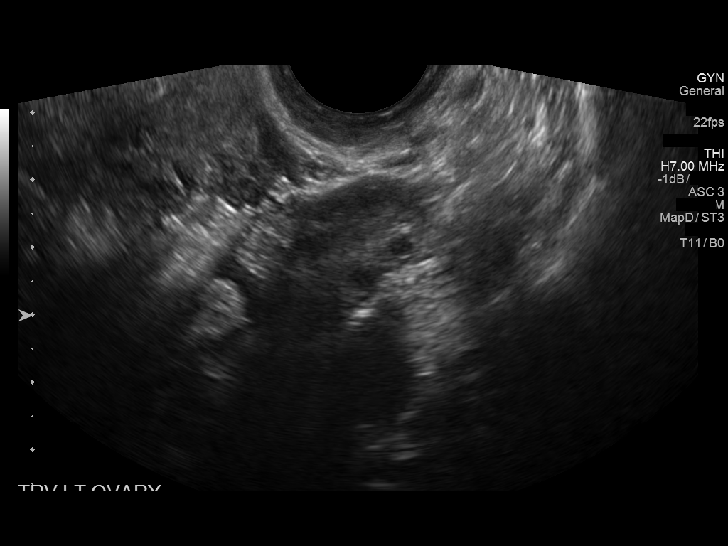
[im 90/90]
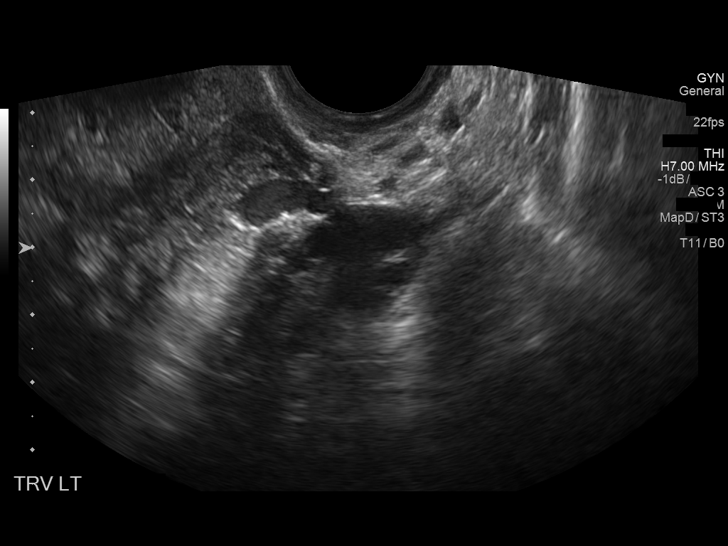

[14 of 25 positions shown; findings below may reference images not displayed]

FINDINGS: Uterus

Measurements: 10.1 x 5.0 x 4.4 cm. 1.4 cm ill-defined hypoechoic
areas seen posteriorly consistent with small fibroid.

Endometrium

Thickness: 10.6 mm which is within normal limits for a patient of
reproductive age. No focal abnormality visualized.

Right ovary

Measurements: 4.1 x 2.3 x 1.9 cm. Normal appearance/no adnexal mass.

Left ovary

Measurements: 3.8 x 2.1 x 1.7 cm. Normal appearance/no adnexal mass.

Other findings

No free fluid.
IMPRESSION: Probable small uterine fibroid. No other abnormality seen in the
pelvis.

## 2016-04-28 ENCOUNTER — Encounter (HOSPITAL_BASED_OUTPATIENT_CLINIC_OR_DEPARTMENT_OTHER): Payer: Self-pay | Admitting: *Deleted

## 2016-04-28 ENCOUNTER — Emergency Department (HOSPITAL_BASED_OUTPATIENT_CLINIC_OR_DEPARTMENT_OTHER)
Admission: EM | Admit: 2016-04-28 | Discharge: 2016-04-28 | Disposition: A | Discharge status: Home / Self Care | Payer: PRIVATE HEALTH INSURANCE | Attending: Emergency Medicine | Admitting: Emergency Medicine

## 2016-04-28 DIAGNOSIS — F17210 Nicotine dependence, cigarettes, uncomplicated: Secondary | ICD-10-CM | POA: Insufficient documentation

## 2016-04-28 DIAGNOSIS — N760 Acute vaginitis: Secondary | ICD-10-CM

## 2016-04-28 DIAGNOSIS — B373 Candidiasis of vulva and vagina: Secondary | ICD-10-CM | POA: Insufficient documentation

## 2016-04-28 DIAGNOSIS — B3731 Acute candidiasis of vulva and vagina: Secondary | ICD-10-CM

## 2016-04-28 DIAGNOSIS — B9689 Other specified bacterial agents as the cause of diseases classified elsewhere: Secondary | ICD-10-CM

## 2016-04-28 LAB — URINALYSIS, MICROSCOPIC (REFLEX): RBC / HPF: NONE SEEN RBC/hpf (ref 0–5)

## 2016-04-28 LAB — WET PREP, GENITAL
SPERM: NONE SEEN
Trich, Wet Prep: NONE SEEN

## 2016-04-28 LAB — URINALYSIS, ROUTINE W REFLEX MICROSCOPIC
BILIRUBIN URINE: NEGATIVE
GLUCOSE, UA: NEGATIVE mg/dL
HGB URINE DIPSTICK: NEGATIVE
KETONES UR: NEGATIVE mg/dL
NITRITE: NEGATIVE
PH: 5.5 (ref 5.0–8.0)
Protein, ur: NEGATIVE mg/dL
SPECIFIC GRAVITY, URINE: 1.018 (ref 1.005–1.030)

## 2016-04-28 LAB — PREGNANCY, URINE: Preg Test, Ur: NEGATIVE

## 2016-04-28 MED ORDER — FLUCONAZOLE 150 MG PO TABS: 150.0000 mg | ORAL_TABLET | Freq: Once | ORAL | 0 refills | Status: AC

## 2016-04-28 MED ORDER — METRONIDAZOLE 500 MG PO TABS: 500.0000 mg | ORAL_TABLET | Freq: Two times a day (BID) | ORAL | 0 refills | Status: DC

## 2016-04-28 MED FILL — FLUCONAZOLE 150 MG TABLET: 150 | 1 days supply | Qty: 1 | Fill #0

## 2016-04-28 MED FILL — metroNIDAZOLE 500 MG TABS: 500 | 7 days supply | Qty: 14 | Fill #0

## 2016-04-28 NOTE — ED Triage Notes (Signed)
C/o swelling in vaginal area since Friday. Has yellow discharge.  No urinary sx. No abd pain.

## 2016-04-28 NOTE — ED Provider Notes (Signed)
[facility] Provider Note   CSN: [id number] Arrival date & time: 04/28/16  0731     History   Chief Complaint Chief Complaint  Patient presents with  . Vaginal Discharge    HPI Belinda Alvarado is a 36 y.o. female.  HPI Patient has pain and swelling in her vaginal area. Began 3 days ago. States she initially thought was her detergent so she stopped wearing panties. States she uses Gain detergent. No fevers. Has had some thick yellowish discharge. States it is back between her lips. States she is swollen. Denies possibility of pregnancy since she's had her tubes tied is worried about an STD. She has received oral sex recently. No abdominal pain. No fevers. No dysuria.   History reviewed. No pertinent past medical history.  There are no active problems to display for this patient.   Past Surgical History:  Procedure Laterality Date  . TUBAL LIGATION      OB History    No data available       Home Medications    Prior to Admission medications   Medication Sig Start Date End Date Taking? Authorizing Provider  fluconazole (DIFLUCAN) 150 MG tablet Take 1 tablet (150 mg total) by mouth once. 04/28/16 04/28/16  Benjiman Core, MD  metroNIDAZOLE (FLAGYL) 500 MG tablet Take 1 tablet (500 mg total) by mouth 2 (two) times daily. 04/28/16   Benjiman Core, MD    Family History No family history on file.  Social History Social History  Substance Use Topics  . Smoking status: Current Every Day Smoker    Packs/day: 1.00    Types: Cigarettes  . Smokeless tobacco: Never Used  . Alcohol use Yes     Comment: occasional     Allergies   Latex   Review of Systems Review of Systems  Constitutional: Negative for appetite change, chills and fever.  HENT: Negative for sore throat.   Respiratory: Negative for shortness of breath.   Cardiovascular: Negative for chest pain.  Gastrointestinal: Negative for abdominal pain.  Endocrine: Negative for polyphagia and  polyuria.  Genitourinary: Positive for vaginal discharge and vaginal pain. Negative for flank pain, menstrual problem and vaginal bleeding.  Musculoskeletal: Negative for back pain.  Skin: Negative for rash.  Neurological: Negative for weakness and numbness.  Hematological: Negative for adenopathy.     Physical Exam Updated Vital Signs BP 113/65 (BP Location: Right Arm)   Pulse 80   Temp 98.1 F (36.7 C) (Oral)   Resp 16   Ht 5\' 8"  (1.727 m)   Wt 243 lb (110.2 kg)   LMP 04/04/2016   SpO2 97%   BMI 36.95 kg/m   Physical Exam  Constitutional: She appears well-developed.  HENT:  Head: Normocephalic.  Eyes: Pupils are equal, round, and reactive to light.  Neck: Neck supple.  Cardiovascular: Normal rate.   Pulmonary/Chest: Effort normal.  Abdominal: There is no tenderness.  Genitourinary:  Genitourinary Comments: Thick chunky greenish vaginal discharge. Slight erythema of external labia but no abscess induration or tenderness. No other skin changes. No cervical motion tenderness or adnexal tenderness.  Musculoskeletal: She exhibits no edema.  Neurological: She is alert.  Skin: Skin is warm. Capillary refill takes less than 2 seconds.  Psychiatric: She has a normal mood and affect.     ED Treatments / Results  Labs (all labs ordered are listed, but only abnormal results are displayed) Labs Reviewed  WET PREP, GENITAL - Abnormal; Notable for the following:  Result Value   Yeast Wet Prep HPF POC PRESENT (*)    Clue Cells Wet Prep HPF POC PRESENT (*)    WBC, Wet Prep HPF POC MANY (*)    All other components within normal limits  URINALYSIS, ROUTINE W REFLEX MICROSCOPIC - Abnormal; Notable for the following:    Leukocytes, UA MODERATE (*)    All other components within normal limits  URINALYSIS, MICROSCOPIC (REFLEX) - Abnormal; Notable for the following:    Bacteria, UA FEW (*)    Squamous Epithelial / LPF 0-5 (*)    All other components within normal limits    PREGNANCY, URINE  RPR  HIV ANTIBODY (ROUTINE TESTING)  GC/CHLAMYDIA PROBE AMP ([place]) NOT AT [facility]    EKG  EKG Interpretation None       Radiology No results found.  Procedures Procedures (including critical care time)  Medications Ordered in ED Medications - No data to display   Initial Impression / Assessment and Plan / ED Course  I have reviewed the triage vital signs and the nursing notes.  Pertinent labs & imaging results that were available during my care of the patient were reviewed by me and considered in my medical decision making (see chart for details).     Patient vaginal discharge. Will treat for BV and trichomoniasis. No cervical motion tenderness. Other cultures sent. No external abscess. Will discharge home.  Final Clinical Impressions(s) / ED Diagnoses   Final diagnoses:  BV (bacterial vaginosis)  Yeast vaginitis    New Prescriptions New Prescriptions   FLUCONAZOLE (DIFLUCAN) 150 MG TABLET    Take 1 tablet (150 mg total) by mouth once.   METRONIDAZOLE (FLAGYL) 500 MG TABLET    Take 1 tablet (500 mg total) by mouth 2 (two) times daily.     Benjiman CoreNathan Latrish Mogel, MD 04/28/16 [phone]

## 2016-04-29 LAB — HIV ANTIBODY (ROUTINE TESTING W REFLEX): HIV Screen 4th Generation wRfx: NONREACTIVE

## 2016-04-29 LAB — GC/CHLAMYDIA PROBE AMP (~~LOC~~) NOT AT ARMC
CHLAMYDIA, DNA PROBE: NEGATIVE
Neisseria Gonorrhea: NEGATIVE

## 2016-04-29 LAB — RPR: RPR Ser Ql: NONREACTIVE

## 2019-04-24 ENCOUNTER — Emergency Department (HOSPITAL_BASED_OUTPATIENT_CLINIC_OR_DEPARTMENT_OTHER): Payer: Self-pay

## 2019-04-24 ENCOUNTER — Encounter (HOSPITAL_BASED_OUTPATIENT_CLINIC_OR_DEPARTMENT_OTHER): Payer: Self-pay

## 2019-04-24 ENCOUNTER — Other Ambulatory Visit: Payer: Self-pay

## 2019-04-24 ENCOUNTER — Emergency Department (HOSPITAL_BASED_OUTPATIENT_CLINIC_OR_DEPARTMENT_OTHER)
Admission: EM | Admit: 2019-04-24 | Discharge: 2019-04-24 | Disposition: A | Discharge status: Home / Self Care | Payer: Self-pay | Attending: Emergency Medicine | Admitting: Emergency Medicine

## 2019-04-24 DIAGNOSIS — K5790 Diverticulosis of intestine, part unspecified, without perforation or abscess without bleeding: Secondary | ICD-10-CM | POA: Insufficient documentation

## 2019-04-24 DIAGNOSIS — R109 Unspecified abdominal pain: Secondary | ICD-10-CM

## 2019-04-24 DIAGNOSIS — Z91040 Latex allergy status: Secondary | ICD-10-CM | POA: Insufficient documentation

## 2019-04-24 DIAGNOSIS — N83202 Unspecified ovarian cyst, left side: Secondary | ICD-10-CM | POA: Insufficient documentation

## 2019-04-24 DIAGNOSIS — F17210 Nicotine dependence, cigarettes, uncomplicated: Secondary | ICD-10-CM | POA: Insufficient documentation

## 2019-04-24 LAB — URINALYSIS, ROUTINE W REFLEX MICROSCOPIC
Bilirubin Urine: NEGATIVE
Glucose, UA: NEGATIVE mg/dL
Hgb urine dipstick: NEGATIVE
Ketones, ur: NEGATIVE mg/dL
Leukocytes,Ua: NEGATIVE
Nitrite: NEGATIVE
Protein, ur: NEGATIVE mg/dL
Specific Gravity, Urine: >1.03 — ABNORMAL HIGH (ref 1.005–1.030)
pH: 6 (ref 5.0–8.0)

## 2019-04-24 LAB — CBC WITH DIFFERENTIAL/PLATELET
Abs Immature Granulocytes: 0 10*3/uL (ref 0.00–0.07)
Basophils Absolute: 0 10*3/uL (ref 0.0–0.1)
Basophils Relative: 0 %
Eosinophils Absolute: 0.1 10*3/uL (ref 0.0–0.5)
Eosinophils Relative: 2 %
HCT: 42.8 % (ref 36.0–46.0)
Hemoglobin: 14.6 g/dL (ref 12.0–15.0)
Immature Granulocytes: 0 %
Lymphocytes Relative: 53 %
Lymphs Abs: 2.5 10*3/uL (ref 0.7–4.0)
MCH: 36 pg — ABNORMAL HIGH (ref 26.0–34.0)
MCHC: 34.1 g/dL (ref 30.0–36.0)
MCV: 105.4 fL — ABNORMAL HIGH (ref 80.0–100.0)
Monocytes Absolute: 0.6 10*3/uL (ref 0.1–1.0)
Monocytes Relative: 12 %
Neutro Abs: 1.6 10*3/uL — ABNORMAL LOW (ref 1.7–7.7)
Neutrophils Relative %: 33 %
Platelets: 211 10*3/uL (ref 150–400)
RBC: 4.06 MIL/uL (ref 3.87–5.11)
RDW: 12.3 % (ref 11.5–15.5)
WBC: 4.8 10*3/uL (ref 4.0–10.5)
nRBC: 0 % (ref 0.0–0.2)

## 2019-04-24 LAB — COMPREHENSIVE METABOLIC PANEL
ALT: 23 U/L (ref 0–44)
AST: 22 U/L (ref 15–41)
Albumin: 3.9 g/dL (ref 3.5–5.0)
Alkaline Phosphatase: 47 U/L (ref 38–126)
Anion gap: 7 (ref 5–15)
BUN: 12 mg/dL (ref 6–20)
CO2: 25 mmol/L (ref 22–32)
Calcium: 9 mg/dL (ref 8.9–10.3)
Chloride: 104 mmol/L (ref 98–111)
Creatinine, Ser: 0.92 mg/dL (ref 0.44–1.00)
GFR calc Af Amer: >60 mL/min (ref >60)
GFR calc non Af Amer: >60 mL/min (ref >60)
Glucose, Bld: 85 mg/dL (ref 70–99)
Potassium: 4 mmol/L (ref 3.5–5.1)
Sodium: 136 mmol/L (ref 135–145)
Total Bilirubin: 0.9 mg/dL (ref 0.3–1.2)
Total Protein: 7.6 g/dL (ref 6.5–8.1)

## 2019-04-24 LAB — PREGNANCY, URINE: Preg Test, Ur: NEGATIVE

## 2019-04-24 LAB — LIPASE, BLOOD: Lipase: 27 U/L (ref 11–51)

## 2019-04-24 MED ORDER — IBUPROFEN 800 MG PO TABS
800.0000 mg | ORAL_TABLET | Freq: Once | ORAL | Status: AC
  Administered 2019-04-24: 800 mg via ORAL
  Filled 2019-04-24: qty 1

## 2019-04-24 MED ORDER — IOHEXOL 300 MG/ML  SOLN
100.0000 mL | Freq: Once | INTRAMUSCULAR | Status: AC | PRN
  Administered 2019-04-24: 100 mL via INTRAVENOUS

## 2019-04-24 NOTE — ED Notes (Signed)
Given Multiple heat packs

## 2019-04-24 NOTE — Discharge Instructions (Signed)
Please read and follow all provided instructions.  Your diagnoses today include:  1. Left sided abdominal pain     Tests performed today include:  Blood counts and electrolytes  Blood tests to check liver and kidney function  Blood tests to check pancreas function  Urine test to look for infection and pregnancy (in women)  CT scan - shows left ovarian cyst and diverticulosis  Vital signs. See below for your results today.   Medications prescribed:  Please use over-the-counter NSAID medications (ibuprofen, naproxen) as directed on the packaging for pain.   Take any prescribed medications only as directed.  Home care instructions:   Follow any educational materials contained in this packet.  Follow-up instructions: Please follow-up with your OB/GYN for further evaluation in the next 3-5 days.   Return instructions:  SEEK IMMEDIATE MEDICAL ATTENTION IF:  The pain does not go away or becomes severe   A temperature above 101F develops   Repeated vomiting occurs (multiple episodes)   The pain becomes localized to portions of the abdomen. The right side could possibly be appendicitis. In an adult, the left lower portion of the abdomen could be colitis or diverticulitis.   Blood is being passed in stools or vomit (bright red or black tarry stools)   You develop chest pain, difficulty breathing, dizziness or fainting, or become confused, poorly responsive, or inconsolable (young children)  If you have any other emergent concerns regarding your health  Additional Information: Abdominal (belly) pain can be caused by many things. Your caregiver performed an examination and possibly ordered blood/urine tests and imaging (CT scan, x-rays, ultrasound). Many cases can be observed and treated at home after initial evaluation in the emergency department. Even though you are being discharged home, abdominal pain can be unpredictable. Therefore, you need a repeated exam if your pain  does not resolve, returns, or worsens. Most patients with abdominal pain don't have to be admitted to the hospital or have surgery, but serious problems like appendicitis and gallbladder attacks can start out as nonspecific pain. Many abdominal conditions cannot be diagnosed in one visit, so follow-up evaluations are very important.  Your vital signs today were: BP 124/61 (BP Location: Right Arm)   Pulse 67   Temp 98.5 F (36.9 C) (Oral)   Resp 14   Ht 5\' 8"  (1.727 m)   Wt 115.7 kg   LMP 03/25/2019   SpO2 99%   BMI 38.77 kg/m  If your blood pressure (bp) was elevated above 135/85 this visit, please have this repeated by your doctor within one month. --------------

## 2019-04-24 NOTE — ED Triage Notes (Addendum)
Pt arrives tearful to triage with c/o LLQ and left flank pain that has been ongoing. Pt reports it started about a month ago with her cycle and then went away and came back. Denies NVD. Denies urinary symptoms.

## 2019-04-24 NOTE — ED Provider Notes (Signed)
[facility] EMERGENCY DEPARTMENT Provider Note   CSN: [id number] Arrival date & time: 04/24/19  1749     History Chief Complaint  Patient presents with  . Abdominal Pain    Belinda Alvarado is a 39 y.o. female.  Patient presents to the emergency department with complaint of severe left lower quadrant and left lateral abdominal pain starting 2 days ago.  No previous abdominal surgeries or procedures except for a tubal ligation.  Patient states that she had similar pain a month ago which started just prior to her menstrual period.  The pain persisted during her period and then resolved.  Patient is not currently on her menstrual period.  She denies any vaginal bleeding or discharge.  No nausea, vomiting, or diarrhea.  She feels that she is having fewer bowel movements per day than normal.  No blood in the stool.  No improvement with OTC meds.  No dysuria, hematuria, increased frequency or urgency.  Patient states that she is doing a cleanse due to decreased bowel movements currently.  Pain is sharp and radiates around to her left back.  No fevers.        History reviewed. No pertinent past medical history.  There are no problems to display for this patient.   Past Surgical History:  Procedure Laterality Date  . TUBAL LIGATION       OB History   No obstetric history on file.     No family history on file.  Social History   Tobacco Use  . Smoking status: Current Every Day Smoker    Packs/day: 1.00    Types: Cigarettes  . Smokeless tobacco: Never Used  Substance Use Topics  . Alcohol use: Yes    Comment: occasional  . Drug use: Yes    Types: Marijuana    Comment: occasional use    Home Medications Prior to Admission medications   Medication Sig Start Date End Date Taking? Authorizing Provider  metroNIDAZOLE (FLAGYL) 500 MG tablet Take 1 tablet (500 mg total) by mouth 2 (two) times daily. 04/28/16   Davonna Belling, MD    Allergies    Latex  Review of  Systems   Review of Systems  Constitutional: Negative for fever.  HENT: Negative for rhinorrhea and sore throat.   Eyes: Negative for redness.  Respiratory: Negative for cough.   Cardiovascular: Negative for chest pain.  Gastrointestinal: Positive for abdominal pain and constipation. Negative for diarrhea, nausea and vomiting.  Genitourinary: Positive for flank pain. Negative for dysuria.  Musculoskeletal: Positive for back pain. Negative for myalgias.  Skin: Negative for rash.  Neurological: Negative for headaches.    Physical Exam Updated Vital Signs BP (!) 146/90 (BP Location: Right Arm)   Pulse 77   Temp 98.5 F (36.9 C) (Oral)   Resp 18   Ht 5\' 8"  (1.727 m)   Wt 115.7 kg   LMP 03/25/2019   SpO2 99%   BMI 38.77 kg/m   Physical Exam Vitals and nursing note reviewed.  Constitutional:      Appearance: She is well-developed.  HENT:     Head: Normocephalic and atraumatic.  Eyes:     General:        Right eye: No discharge.        Left eye: No discharge.     Conjunctiva/sclera: Conjunctivae normal.  Cardiovascular:     Rate and Rhythm: Normal rate and regular rhythm.     Heart sounds: Normal heart sounds.  Pulmonary:  Effort: Pulmonary effort is normal.     Breath sounds: Normal breath sounds.  Abdominal:     Palpations: Abdomen is soft.     Tenderness: There is abdominal tenderness (Moderate). There is no guarding or rebound. Negative signs include McBurney's sign.    Musculoskeletal:     Cervical back: Normal range of motion and neck supple.  Skin:    General: Skin is warm and dry.  Neurological:     Mental Status: She is alert.     ED Results / Procedures / Treatments   Labs (all labs ordered are listed, but only abnormal results are displayed) Labs Reviewed  CBC WITH DIFFERENTIAL/PLATELET - Abnormal; Notable for the following components:      Result Value   MCV 105.4 (*)    MCH 36.0 (*)    Neutro Abs 1.6 (*)    All other components within  normal limits  URINALYSIS, ROUTINE W REFLEX MICROSCOPIC - Abnormal; Notable for the following components:   Specific Gravity, Urine >1.030 (*)    All other components within normal limits  COMPREHENSIVE METABOLIC PANEL  LIPASE, BLOOD  PREGNANCY, URINE    EKG None  Radiology CT ABDOMEN PELVIS W CONTRAST  Result Date: 04/24/2019 CLINICAL DATA:  Left lower quadrant abdominal pain. EXAM: CT ABDOMEN AND PELVIS WITH CONTRAST TECHNIQUE: Multidetector CT imaging of the abdomen and pelvis was performed using the standard protocol following bolus administration of intravenous contrast. CONTRAST:  OMNIPAQUE IOHEXOL 300 MG/ML  SOLN COMPARISON:  CT dated 08/16/2008. FINDINGS: Lower chest: The lung bases are clear. The heart size is normal. Hepatobiliary: The liver is normal. Normal gallbladder.There is no biliary ductal dilation. Pancreas: Normal contours without ductal dilatation. No peripancreatic fluid collection. Spleen: No splenic laceration or hematoma. Adrenals/Urinary Tract: --Adrenal glands: No adrenal hemorrhage. --Right kidney/ureter: No hydronephrosis or perinephric hematoma. --Left kidney/ureter: No hydronephrosis or perinephric hematoma. --Urinary bladder: Unremarkable. Stomach/Bowel: --Stomach/Duodenum: No hiatal hernia or other gastric abnormality. Normal duodenal course and caliber. --Small bowel: No dilatation or inflammation. --Colon: There is scattered colonic diverticula without CT evidence for diverticulitis. --Appendix: Normal. Vascular/Lymphatic: Normal course and caliber of the major abdominal vessels. --No retroperitoneal lymphadenopathy. --No mesenteric lymphadenopathy. --No pelvic or inguinal lymphadenopathy. Reproductive: There is a complex appearing cystic structure involving the left ovary measuring approximately 3.2 x 2.6 cm. Other: No ascites or free air. There is a fat containing umbilical hernia. Musculoskeletal. No acute displaced fractures. IMPRESSION: 1. Scattered colonic  diverticula without evidence of diverticulitis. 2. Complex appearing cystic structure involving the left ovary measuring approximately 3.2 x 2.6 cm. Consider further evaluation with pelvic ultrasound given the patient's reported left lower quadrant pain. 3. Fat containing umbilical hernia. Electronically Signed   By: Katherine Mantle M.D.   On: 04/24/2019 20:14    Procedures Procedures (including critical care time)  Medications Ordered in ED Medications  ibuprofen (ADVIL) tablet 800 mg (has no administration in time range)  iohexol (OMNIPAQUE) 300 MG/ML solution 100 mL (100 mLs Intravenous Contrast Given 04/24/19 2001)    ED Course  I have reviewed the triage vital signs and the nursing notes.  Pertinent labs & imaging results that were available during my care of the patient were reviewed by me and considered in my medical decision making (see chart for details).  Patient seen and examined. Work-up initiated.  Patient declines pain medication.  Her tenderness is significant enough that I feel she likely need a CT scan if not pregnant.  Vital signs reviewed and are as  follows: BP (!) 146/90 (BP Location: Right Arm)   Pulse 77   Temp 98.5 F (36.9 C) (Oral)   Resp 18   Ht 5\' 8"  (1.727 m)   Wt 115.7 kg   LMP 03/25/2019   SpO2 99%   BMI 38.77 kg/m   8:42 PM CT performed and reviewed.  Patient has evidence of a left ovarian cyst as well as diverticulosis without evidence of diverticulitis.  Otherwise reassuring CT.  Patient will be discharged home.  She will continue ibuprofen and Tylenol for pain.  Patient has previously seen OB/GYN in [facility], will give information for [facility] clinic as well.  She is encouraged to call tomorrow to schedule an appointment.  The patient was urged to return to the Emergency Department immediately with worsening of current symptoms, worsening abdominal pain, persistent vomiting, blood noted in stools, fever, or any other concerns. The  patient verbalized understanding.     MDM Rules/Calculators/A&P                      Patient with abdominal pain, left lateral and left lower. Normal white blood cell count. No vaginal or GU complaints. Vitals are stable, no fever. Labs overall reassuring. Imaging without signs of infection, demonstrates left ovarian cyst which may be contributing, cannot rule out endometriosis due to timing of the patient's symptoms and recurrent nature around time of menstruation. No signs of dehydration, patient is tolerating PO's. Lungs are clear and no signs suggestive of PNA. Doubt ovarian torsion, TOA given timing and lack of fever, vomiting. Low concern for appendicitis, cholecystitis, pancreatitis, ruptured viscus, UTI, kidney stone, aortic dissection, aortic aneurysm or other emergent abdominal etiology. Supportive therapy indicated with return if symptoms worsen.      Final Clinical Impression(s) / ED Diagnoses Final diagnoses:  Left sided abdominal pain  Cyst of left ovary  Diverticulosis    Rx / DC Orders ED Discharge Orders    None       [facility] 04/24/19 2049    2050, MD 04/24/19 [phone]

## 2020-04-13 ENCOUNTER — Other Ambulatory Visit: Payer: Self-pay

## 2020-04-13 ENCOUNTER — Emergency Department (HOSPITAL_COMMUNITY): Payer: PRIVATE HEALTH INSURANCE

## 2020-04-13 ENCOUNTER — Emergency Department (HOSPITAL_COMMUNITY)
Admission: EM | Admit: 2020-04-13 | Discharge: 2020-04-14 | Disposition: A | Discharge status: Home / Self Care | Payer: PRIVATE HEALTH INSURANCE | Attending: Emergency Medicine | Admitting: Emergency Medicine

## 2020-04-13 ENCOUNTER — Encounter (HOSPITAL_COMMUNITY): Payer: Self-pay

## 2020-04-13 DIAGNOSIS — X58XXXA Exposure to other specified factors, initial encounter: Secondary | ICD-10-CM | POA: Insufficient documentation

## 2020-04-13 DIAGNOSIS — F1390 Sedative, hypnotic, or anxiolytic use, unspecified, uncomplicated: Secondary | ICD-10-CM | POA: Insufficient documentation

## 2020-04-13 DIAGNOSIS — Z91040 Latex allergy status: Secondary | ICD-10-CM | POA: Insufficient documentation

## 2020-04-13 DIAGNOSIS — F17210 Nicotine dependence, cigarettes, uncomplicated: Secondary | ICD-10-CM | POA: Insufficient documentation

## 2020-04-13 DIAGNOSIS — F1590 Other stimulant use, unspecified, uncomplicated: Secondary | ICD-10-CM | POA: Insufficient documentation

## 2020-04-13 DIAGNOSIS — F1910 Other psychoactive substance abuse, uncomplicated: Secondary | ICD-10-CM

## 2020-04-13 DIAGNOSIS — F1290 Cannabis use, unspecified, uncomplicated: Secondary | ICD-10-CM | POA: Insufficient documentation

## 2020-04-13 DIAGNOSIS — R404 Transient alteration of awareness: Secondary | ICD-10-CM | POA: Insufficient documentation

## 2020-04-13 DIAGNOSIS — S01511A Laceration without foreign body of lip, initial encounter: Secondary | ICD-10-CM | POA: Insufficient documentation

## 2020-04-13 LAB — BLOOD GAS, VENOUS
Acid-base deficit: 0.3 mmol/L (ref 0.0–2.0)
Bicarbonate: 25.7 mmol/L (ref 20.0–28.0)
O2 Saturation: 31.7 %
Patient temperature: 98.6
pCO2, Ven: 48.8 mmHg (ref 44.0–60.0)
pH, Ven: 7.341 (ref 7.250–7.430)
pO2, Ven: 23 mmHg — CL (ref 32.0–45.0)

## 2020-04-13 LAB — COMPREHENSIVE METABOLIC PANEL
ALT: 24 U/L (ref 0–44)
AST: 23 U/L (ref 15–41)
Albumin: 3.7 g/dL (ref 3.5–5.0)
Alkaline Phosphatase: 49 U/L (ref 38–126)
Anion gap: 10 (ref 5–15)
BUN: 14 mg/dL (ref 6–20)
CO2: 25 mmol/L (ref 22–32)
Calcium: 9.1 mg/dL (ref 8.9–10.3)
Chloride: 104 mmol/L (ref 98–111)
Creatinine, Ser: 1.01 mg/dL — ABNORMAL HIGH (ref 0.44–1.00)
GFR, Estimated: >60 mL/min (ref >60)
Glucose, Bld: 82 mg/dL (ref 70–99)
Potassium: 3.8 mmol/L (ref 3.5–5.1)
Sodium: 139 mmol/L (ref 135–145)
Total Bilirubin: 0.9 mg/dL (ref 0.3–1.2)
Total Protein: 7.4 g/dL (ref 6.5–8.1)

## 2020-04-13 LAB — CBC WITH DIFFERENTIAL/PLATELET
Abs Immature Granulocytes: 0.01 10*3/uL (ref 0.00–0.07)
Basophils Absolute: 0 10*3/uL (ref 0.0–0.1)
Basophils Relative: 0 %
Eosinophils Absolute: 0 10*3/uL (ref 0.0–0.5)
Eosinophils Relative: 1 %
HCT: 40.5 % (ref 36.0–46.0)
Hemoglobin: 13.6 g/dL (ref 12.0–15.0)
Immature Granulocytes: 0 %
Lymphocytes Relative: 56 %
Lymphs Abs: 2.2 10*3/uL (ref 0.7–4.0)
MCH: 35.1 pg — ABNORMAL HIGH (ref 26.0–34.0)
MCHC: 33.6 g/dL (ref 30.0–36.0)
MCV: 104.4 fL — ABNORMAL HIGH (ref 80.0–100.0)
Monocytes Absolute: 0.4 10*3/uL (ref 0.1–1.0)
Monocytes Relative: 9 %
Neutro Abs: 1.4 10*3/uL — ABNORMAL LOW (ref 1.7–7.7)
Neutrophils Relative %: 34 %
Platelets: 209 10*3/uL (ref 150–400)
RBC: 3.88 MIL/uL (ref 3.87–5.11)
RDW: 13 % (ref 11.5–15.5)
WBC Morphology: >10
WBC: 4 10*3/uL (ref 4.0–10.5)
nRBC: 0 % (ref 0.0–0.2)

## 2020-04-13 LAB — URINALYSIS, ROUTINE W REFLEX MICROSCOPIC
Bilirubin Urine: NEGATIVE
Glucose, UA: NEGATIVE mg/dL
Hgb urine dipstick: NEGATIVE
Ketones, ur: NEGATIVE mg/dL
Leukocytes,Ua: NEGATIVE
Nitrite: NEGATIVE
Protein, ur: NEGATIVE mg/dL
Specific Gravity, Urine: 1.004 — ABNORMAL LOW (ref 1.005–1.030)
pH: 6 (ref 5.0–8.0)

## 2020-04-13 LAB — RAPID URINE DRUG SCREEN, HOSP PERFORMED
Amphetamines: POSITIVE — AB
Barbiturates: NOT DETECTED
Benzodiazepines: POSITIVE — AB
Cocaine: NOT DETECTED
Opiates: NOT DETECTED
Tetrahydrocannabinol: POSITIVE — AB

## 2020-04-13 LAB — ACETAMINOPHEN LEVEL: Acetaminophen (Tylenol), Serum: <10 ug/mL — ABNORMAL LOW (ref 10–30)

## 2020-04-13 LAB — ETHANOL: Alcohol, Ethyl (B): <10 mg/dL (ref <10)

## 2020-04-13 LAB — SALICYLATE LEVEL: Salicylate Lvl: <7 mg/dL — ABNORMAL LOW (ref 7.0–30.0)

## 2020-04-13 LAB — LACTIC ACID, PLASMA: Lactic Acid, Venous: 1.2 mmol/L (ref 0.5–1.9)

## 2020-04-13 MED ORDER — SODIUM CHLORIDE 0.9 % IV BOLUS
1000.0000 mL | Freq: Once | INTRAVENOUS | Status: AC
  Administered 2020-04-13: 1000 mL via INTRAVENOUS

## 2020-04-13 NOTE — ED Triage Notes (Signed)
Pt arrives POV with sister. Sister reports someone called her at work and said that she needed to come get her sister because she is not acting right. Sister is unsure if she took something, but believes that she did. Pt attempts to follow commands, but then become limp upon trying to move. Pt has incomprehensible speech at this time and cannot answer any questions.

## 2020-04-13 NOTE — ED Provider Notes (Signed)
Belinda Alvarado is a 40 y.o. female.  40 yo F with a chief complaints of altered mental status.  Reportedly the patient was found altered EMS was called but no one was home except for her.  Her sister was notified and thinks that maybe she took something.  Unable to elaborate much further.  Patient is confused and not able to provide much history.  Level five caveat.   Altered Mental Status      History reviewed. No pertinent past medical history.  There are no problems to display for this patient.   Past Surgical History:  Procedure Laterality Date  . TUBAL LIGATION       OB History   No obstetric history on file.     History reviewed. No pertinent family history.  Social History   Tobacco Use  . Smoking status: Current Every Day Smoker    Packs/day: 1.00    Types: Cigarettes  . Smokeless tobacco: Never Used  Substance Use Topics  . Alcohol use: Yes    Comment: occasional  . Drug use: Yes    Types: Marijuana    Comment: occasional use    Home Medications Prior to Admission medications   Not on File    Allergies    Latex  Review of Systems   Review of Systems  Unable to perform ROS: Mental status change    Physical Exam Updated Vital Signs BP 130/90   Pulse 78   Resp (!) 22   SpO2 99%   Physical Exam Vitals and nursing note reviewed.  Constitutional:      General: She is not in acute distress.    Appearance: She is well-developed and well-nourished. She is not diaphoretic.     Comments: Awakens to voice, confused response  HENT:     Head: Normocephalic.     Comments: Superficial lower lip laceration to the midline not involving the [place] border.  Eyes:     Extraocular Movements: EOM normal.     Pupils: Pupils are equal, round, and  reactive to light.  Cardiovascular:     Rate and Rhythm: Normal rate and regular rhythm.     Heart sounds: No murmur heard. No friction rub. No gallop.   Pulmonary:     Effort: Pulmonary effort is normal.     Breath sounds: No wheezing or rales.  Abdominal:     General: There is no distension.     Palpations: Abdomen is soft.     Tenderness: There is no abdominal tenderness.  Musculoskeletal:        General: No tenderness or edema.     Cervical back: Normal range of motion and neck supple.  Skin:    General: Skin is warm and dry.  Neurological:     Comments: Patient appears sleepy.  She is alert enough to not want her bra removed.  Is able to refer to it by name.  Moving all four extremities spontaneously.  Psychiatric:        Mood and Affect: Mood and affect normal.     ED Results / Procedures / Treatments   Labs (all labs ordered are listed, but only abnormal results are displayed) Labs Reviewed  COMPREHENSIVE METABOLIC PANEL - Abnormal; Notable for the following components:  Result Value   Creatinine, Ser 1.01 (*)    All other components within normal limits  BLOOD GAS, VENOUS - Abnormal; Notable for the following components:   pO2, Ven 23.0 (*)    All other components within normal limits  CBC WITH DIFFERENTIAL/PLATELET - Abnormal; Notable for the following components:   MCV 104.4 (*)    MCH 35.1 (*)    Neutro Abs 1.4 (*)    All other components within normal limits  URINALYSIS, ROUTINE W REFLEX MICROSCOPIC - Abnormal; Notable for the following components:   Color, Urine STRAW (*)    Specific Gravity, Urine 1.004 (*)    All other components within normal limits  RAPID URINE DRUG SCREEN, HOSP PERFORMED - Abnormal; Notable for the following components:   Benzodiazepines POSITIVE (*)    Amphetamines POSITIVE (*)    Tetrahydrocannabinol POSITIVE (*)    All other components within normal limits  ACETAMINOPHEN LEVEL - Abnormal; Notable for the following components:    Acetaminophen (Tylenol), Serum <10 (*)    All other components within normal limits  SALICYLATE LEVEL - Abnormal; Notable for the following components:   Salicylate Lvl <7.0 (*)    All other components within normal limits  LACTIC ACID, PLASMA  ETHANOL  I-STAT BETA HCG BLOOD, ED ([facility], [facility], [facility] ONLY)    EKG EKG Interpretation  Date/Time:  Friday April 13 2020 16:36:20 EST Ventricular Rate:  87 PR Interval:    QRS Duration: 90 QT Interval:  378 QTC Calculation: 455 R Axis:   -8 Text Interpretation: Sinus rhythm No old tracing to compare Confirmed by Melene Plan [phone]) on 04/13/2020 4:40:47 PM   Radiology CT HEAD WO CONTRAST  Result Date: 04/13/2020 CLINICAL DATA:  Altered level of consciousness EXAM: CT HEAD WITHOUT CONTRAST TECHNIQUE: Contiguous axial images were obtained from the base of the skull through the vertex without intravenous contrast. COMPARISON:  None. FINDINGS: Brain: No evidence of acute infarction, hemorrhage, hydrocephalus, extra-axial collection or mass lesion/mass effect. Vascular: No hyperdense vessel or unexpected calcification. Skull: Normal. Negative for fracture or focal lesion. Sinuses/Orbits: Mild mucosal thickening within the sinuses. Other: None IMPRESSION: Negative non contrasted CT appearance of the brain Electronically Signed   By: Jasmine Pang M.D.   On: 04/13/2020 18:41   DG Chest Port 1 View  Result Date: 04/13/2020 CLINICAL DATA:  Altered level of consciousness EXAM: PORTABLE CHEST 1 VIEW COMPARISON:  None. FINDINGS: The heart size and mediastinal contours are within normal limits. Both lungs are clear. The visualized skeletal structures are unremarkable. IMPRESSION: No active disease. Electronically Signed   By: Jasmine Pang M.D.   On: 04/13/2020 18:05    Procedures Procedures   Medications Ordered in ED Medications  sodium chloride 0.9 % bolus 1,000 mL (0 mLs Intravenous Stopped 04/13/20 2018)    ED Course  I have reviewed the triage  vital signs and the nursing notes.  Pertinent labs & imaging results that were available during my care of the patient were reviewed by me and considered in my medical decision making (see chart for details).    MDM Rules/Calculators/A&P                          40 yo F with a chief complaint of altered mental status.  Her sister who was not with her suspects that she took something.  She is not sure exactly what it is.  However she was with was not there when she  was picked up.  Will obtain a laboratory evaluation including Tylenol and salicylate levels.  CT of the head as she has a small laceration to the lip.  CT head negative.  Patient still very sleepy, mom here thinks patient likely took something.  Otherwise was doing well.  CXR viewed by me without focal infiltrate.   UDS positive for methamphetamines and benzos and marijuana.  Discussed results with family.  Family still feel comfortable taking him home.  Will observe until she has improved mental status and able to ambulate.  The patients results and plan were reviewed and discussed.   Any x-rays performed were independently reviewed by myself.   Differential diagnosis were considered with the presenting HPI.  Medications  sodium chloride 0.9 % bolus 1,000 mL (0 mLs Intravenous Stopped 04/13/20 2018)    Vitals:   04/13/20 1730 04/13/20 1800 04/13/20 1830 04/13/20 1900  BP: 104/61 (!) 129/91 120/82 130/90  Pulse: 76 80 80 78  Resp: (!) 25 (!) 21 20 (!) 22  SpO2: 98% 99% 98% 99%    Final diagnoses:  Transient alteration of awareness     Final Clinical Impression(s) / ED Diagnoses Final diagnoses:  Transient alteration of awareness    Rx / DC Orders ED Discharge Orders    None       Melene Plan, DO 04/13/20 2212

## 2020-04-14 NOTE — ED Provider Notes (Signed)
I assumed care of this patient.  Please see previous provider note for further details of Hx, PE.  Briefly patient is a 40 y.o. female who presented here for polysubstance use and intoxication needing to MTF.  2:17 AM Still needs more MTF.   7:09 AM Sober now. Ambulates well.    The patient appears reasonably screened and/or stabilized for discharge and I doubt any other medical condition or other [facility] requiring further screening, evaluation, or treatment in the ED at this time prior to discharge. Safe for discharge with strict return precautions.  Disposition: Discharge  Condition: Good  I have discussed the results, Dx and Tx plan with the patient/family who expressed understanding and agree(s) with the plan. Discharge instructions discussed at length. The patient/family was given strict return precautions who verbalized understanding of the instructions. No further questions at time of discharge.    ED Discharge Orders    None         Jaelle Campanile, Amadeo Garnet, MD 04/14/20 [phone]

## 2020-04-14 NOTE — ED Notes (Signed)
This nurse awoke pt in efforts to ambulate her. Pt was still confused. She was able to ambulate to the bathroom with one assist but unsteady on her feet. Provider updated on pt status.

## 2020-04-14 NOTE — ED Notes (Signed)
Pt still resting comfortably but appears confused upon arousal.

## 2021-01-08 ENCOUNTER — Other Ambulatory Visit: Payer: Self-pay

## 2021-01-08 ENCOUNTER — Encounter (HOSPITAL_BASED_OUTPATIENT_CLINIC_OR_DEPARTMENT_OTHER): Payer: Self-pay

## 2021-01-08 ENCOUNTER — Emergency Department (HOSPITAL_BASED_OUTPATIENT_CLINIC_OR_DEPARTMENT_OTHER)
Admission: EM | Admit: 2021-01-08 | Discharge: 2021-01-08 | Disposition: A | Discharge status: Home / Self Care | Payer: Self-pay | Attending: Emergency Medicine | Admitting: Emergency Medicine

## 2021-01-08 DIAGNOSIS — Z91040 Latex allergy status: Secondary | ICD-10-CM | POA: Insufficient documentation

## 2021-01-08 DIAGNOSIS — A5909 Other urogenital trichomoniasis: Secondary | ICD-10-CM | POA: Insufficient documentation

## 2021-01-08 DIAGNOSIS — F17210 Nicotine dependence, cigarettes, uncomplicated: Secondary | ICD-10-CM | POA: Insufficient documentation

## 2021-01-08 LAB — URINALYSIS, ROUTINE W REFLEX MICROSCOPIC
Bilirubin Urine: NEGATIVE
Glucose, UA: NEGATIVE mg/dL
Hgb urine dipstick: NEGATIVE
Ketones, ur: NEGATIVE mg/dL
Nitrite: NEGATIVE
Protein, ur: NEGATIVE mg/dL
Specific Gravity, Urine: >=1.03 (ref 1.005–1.030)
pH: 6 (ref 5.0–8.0)

## 2021-01-08 LAB — PREGNANCY, URINE: Preg Test, Ur: NEGATIVE

## 2021-01-08 LAB — URINALYSIS, MICROSCOPIC (REFLEX)

## 2021-01-08 LAB — CBG MONITORING, ED: Glucose-Capillary: 82 mg/dL (ref 70–99)

## 2021-01-08 LAB — WET PREP, GENITAL
Sperm: NONE SEEN
WBC, Wet Prep HPF POC: <10 (ref <10)
Yeast Wet Prep HPF POC: NONE SEEN

## 2021-01-08 MED ORDER — METRONIDAZOLE 500 MG PO TABS
2000.0000 mg | ORAL_TABLET | Freq: Once | ORAL | Status: AC
  Administered 2021-01-08: 2000 mg via ORAL
  Filled 2021-01-08: qty 4

## 2021-01-08 MED ORDER — CEFTRIAXONE SODIUM 500 MG IJ SOLR
500.0000 mg | Freq: Once | INTRAMUSCULAR | Status: AC
  Administered 2021-01-08: 500 mg via INTRAMUSCULAR
  Filled 2021-01-08: qty 500

## 2021-01-08 MED ORDER — LIDOCAINE HCL (PF) 1 % IJ SOLN
1.0000 mL | Freq: Once | INTRAMUSCULAR | Status: AC
  Administered 2021-01-08: 1 mL
  Filled 2021-01-08: qty 5

## 2021-01-08 MED ORDER — AZITHROMYCIN 250 MG PO TABS
1000.0000 mg | ORAL_TABLET | Freq: Once | ORAL | Status: AC
  Administered 2021-01-08: 1000 mg via ORAL
  Filled 2021-01-08: qty 4

## 2021-01-08 NOTE — ED Triage Notes (Signed)
Pt reports urinary frequency and low pelvic pain for 3 weeks.  Denies fever, also reports pain from toe to hip.

## 2021-01-08 NOTE — ED Provider Notes (Signed)
[facility] EMERGENCY DEPARTMENT Provider Note   CSN: [id number] Arrival date & time: 01/08/21  0845     History Chief Complaint  Patient presents with   Dysuria    Belinda Alvarado is a 40 y.o. female.  HPI     40 year old female comes in with chief complaint of burning with urination.  Patient reports that she has been having some urinary discomfort for the last 2 or 3 weeks.  She has more urinary frequency and also some incontinence.  She truly does not have any burning with urination.  She is having some lower quadrant abdominal pain that has been present for about 3 or 4 weeks as well.  She did indicate that her condom broke having intercourse once, but her suspicion for STI is lower.  No history of any pelvic pathology.   History reviewed. No pertinent past medical history.  There are no problems to display for this patient.   Past Surgical History:  Procedure Laterality Date   TUBAL LIGATION       OB History   No obstetric history on file.     History reviewed. No pertinent family history.  Social History   Tobacco Use   Smoking status: Every Day    Packs/day: 1.00    Types: Cigarettes   Smokeless tobacco: Never  Substance Use Topics   Alcohol use: Yes    Comment: occasional   Drug use: Yes    Types: Marijuana    Comment: occasional use    Home Medications Prior to Admission medications   Not on File    Allergies    Latex  Review of Systems   Review of Systems  Constitutional:  Positive for activity change.  Gastrointestinal:  Negative for nausea and vomiting.  Genitourinary:  Positive for pelvic pain and vaginal discharge. Negative for dysuria.  Allergic/Immunologic: Negative for immunocompromised state.   Physical Exam Updated Vital Signs BP 100/76   Pulse 71   Temp 98.1 F (36.7 C) (Oral)   Resp 18   Ht 5\' 7"  (1.702 m)   Wt 111.6 kg   SpO2 98%   BMI 38.53 kg/m   Physical Exam Vitals and nursing note reviewed.  Exam conducted with a chaperone present.  Constitutional:      Appearance: She is well-developed.  HENT:     Head: Normocephalic and atraumatic.  Eyes:     Extraocular Movements: Extraocular movements intact.     Conjunctiva/sclera: Conjunctivae normal.  Cardiovascular:     Rate and Rhythm: Normal rate and regular rhythm.     Heart sounds: Normal heart sounds.  Pulmonary:     Effort: Pulmonary effort is normal. No respiratory distress.  Abdominal:     General: Bowel sounds are normal. There is no distension.     Palpations: Abdomen is soft.     Tenderness: There is no abdominal tenderness. There is no guarding or rebound.  Genitourinary:    Vagina: Vaginal discharge present.     Comments: External exam - normal, no lesions Speculum exam: Pt has some white discharge, no blood Bimanual exam: Patient has no CMT, no adnexal tenderness or fullness and cervical os is closed Musculoskeletal:     Cervical back: Normal range of motion and neck supple.  Skin:    General: Skin is warm and dry.  Neurological:     Mental Status: She is alert and oriented to person, place, and time.    ED Results / Procedures / Treatments   Labs (  all labs ordered are listed, but only abnormal results are displayed) Labs Reviewed  WET PREP, GENITAL - Abnormal; Notable for the following components:      Result Value   Trich, Wet Prep PRESENT (*)    Clue Cells Wet Prep HPF POC PRESENT (*)    WBC, Wet Prep HPF POC <10 (*)    All other components within normal limits  URINALYSIS, ROUTINE W REFLEX MICROSCOPIC - Abnormal; Notable for the following components:   APPearance HAZY (*)    Leukocytes,Ua TRACE (*)    All other components within normal limits  URINALYSIS, MICROSCOPIC (REFLEX) - Abnormal; Notable for the following components:   Bacteria, UA FEW (*)    All other components within normal limits  PREGNANCY, URINE  CBG MONITORING, ED  GC/CHLAMYDIA PROBE AMP ([place]) NOT AT [facility]     EKG None  Radiology No results found.  Procedures Procedures   Medications Ordered in ED Medications  cefTRIAXone (ROCEPHIN) injection 500 mg (has no administration in time range)  lidocaine (PF) (XYLOCAINE) 1 % injection 1 mL (has no administration in time range)  metroNIDAZOLE (FLAGYL) tablet 2,000 mg (has no administration in time range)  azithromycin (ZITHROMAX) tablet 1,000 mg (has no administration in time range)    ED Course  I have reviewed the triage vital signs and the nursing notes.  Pertinent labs & imaging results that were available during my care of the patient were reviewed by me and considered in my medical decision making (see chart for details).    MDM Rules/Calculators/A&P                           40 year old female comes in with chief complaint of pelvic pain and urinary issues.  UA it does not show any signs of infection.  Pelvic exam was completed, trichomonas test is positive on wet prep.  Likely her symptoms are because of urethritis, cervicitis.  We will treat with 2 g of Flagyl, IM ceftriaxone and azithromycin.   Final Clinical Impression(s) / ED Diagnoses Final diagnoses:  Trichomonal cervicitis    Rx / DC Orders ED Discharge Orders     None        Derwood Kaplan, MD 01/08/21 1306

## 2021-01-08 NOTE — Discharge Instructions (Signed)
You are seen in the ER for pelvic pain.  Trichomonas test is positive, we have treated that and also gonococcal and chlamydia infection while you were here.  Recommend that you see your primary care doctor for full battery of STI evaluation. Please notify your partner about this so that they can also get tested and treated.

## 2021-01-09 LAB — GC/CHLAMYDIA PROBE AMP (~~LOC~~) NOT AT ARMC
Chlamydia: NEGATIVE
Comment: NEGATIVE
Comment: NORMAL
Neisseria Gonorrhea: NEGATIVE

## 2021-09-15 IMAGING — CR DG CHEST 1V PORT
1 series · 1 of 1 positions shown · non-contrast
Comparison: None.

CLINICAL DATA: Altered level of consciousness

EXAM:
PORTABLE CHEST 1 VIEW

[x chest ap]
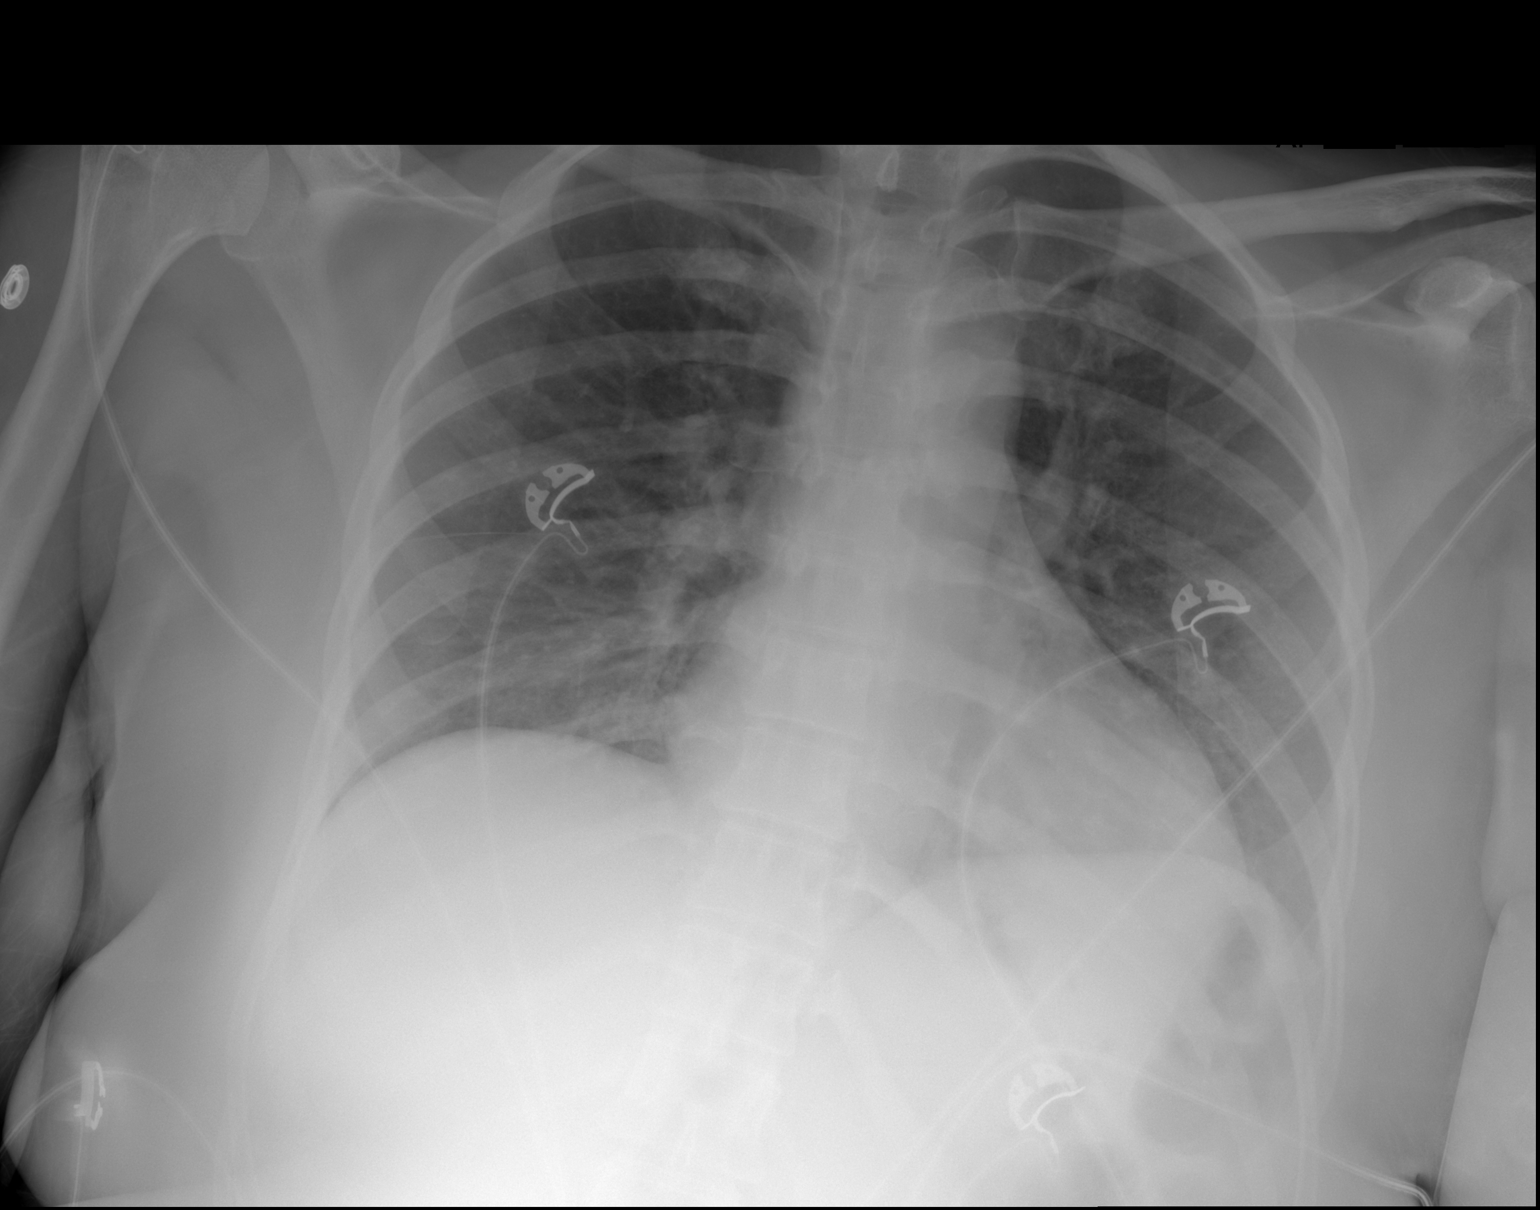

[1 of 1 positions shown; findings below may reference images not displayed]

FINDINGS: The heart size and mediastinal contours are within normal limits.
Both lungs are clear. The visualized skeletal structures are
unremarkable.
IMPRESSION: No active disease.
